# Patient Record
Sex: Female | Born: 1950 | Race: White | Hispanic: No | Marital: Married | Smoking: Never smoker
Health system: Southern US, Community
[De-identification: ages and names within clinical notes are randomized; demographics above are authoritative.]

## PROBLEM LIST (undated history)

## (undated) DIAGNOSIS — R519 Headache, unspecified: Secondary | ICD-10-CM

## (undated) DIAGNOSIS — K589 Irritable bowel syndrome without diarrhea: Secondary | ICD-10-CM

## (undated) DIAGNOSIS — K449 Diaphragmatic hernia without obstruction or gangrene: Secondary | ICD-10-CM

## (undated) DIAGNOSIS — J45909 Unspecified asthma, uncomplicated: Secondary | ICD-10-CM

## (undated) DIAGNOSIS — M791 Myalgia, unspecified site: Secondary | ICD-10-CM

## (undated) DIAGNOSIS — M170 Bilateral primary osteoarthritis of knee: Secondary | ICD-10-CM

## (undated) DIAGNOSIS — G4730 Sleep apnea, unspecified: Secondary | ICD-10-CM

## (undated) DIAGNOSIS — M810 Age-related osteoporosis without current pathological fracture: Secondary | ICD-10-CM

## (undated) DIAGNOSIS — K225 Diverticulum of esophagus, acquired: Secondary | ICD-10-CM

## (undated) DIAGNOSIS — G2581 Restless legs syndrome: Secondary | ICD-10-CM

## (undated) DIAGNOSIS — T7840XA Allergy, unspecified, initial encounter: Secondary | ICD-10-CM

## (undated) DIAGNOSIS — R51 Headache: Secondary | ICD-10-CM

## (undated) DIAGNOSIS — K219 Gastro-esophageal reflux disease without esophagitis: Secondary | ICD-10-CM

## (undated) DIAGNOSIS — M1990 Unspecified osteoarthritis, unspecified site: Secondary | ICD-10-CM

## (undated) HISTORY — PX: KYPHOPLASTY: SHX5884

## (undated) HISTORY — PX: MENISCUS REPAIR: SHX5179

## (undated) HISTORY — DX: Age-related osteoporosis without current pathological fracture: M81.0

## (undated) HISTORY — DX: Allergy, unspecified, initial encounter: T78.40XA

## (undated) HISTORY — DX: Gastro-esophageal reflux disease without esophagitis: K21.9

## (undated) HISTORY — DX: Unspecified asthma, uncomplicated: J45.909

## (undated) HISTORY — DX: Diaphragmatic hernia without obstruction or gangrene: K44.9

## (undated) HISTORY — DX: Headache, unspecified: R51.9

## (undated) HISTORY — DX: Sleep apnea, unspecified: G47.30

## (undated) HISTORY — DX: Headache: R51

## (undated) HISTORY — PX: ESOPHAGOGASTRODUODENOSCOPY: SHX1529

---

## 2004-10-23 HISTORY — PX: CHOLECYSTECTOMY: SHX55

## 2004-10-23 HISTORY — PX: ABDOMINAL HYSTERECTOMY: SHX81

## 2005-01-25 ENCOUNTER — Ambulatory Visit: Payer: Self-pay | Admitting: Unknown Physician Specialty

## 2005-01-27 ENCOUNTER — Ambulatory Visit: Payer: Self-pay | Admitting: Gastroenterology

## 2005-04-06 ENCOUNTER — Ambulatory Visit: Payer: Self-pay | Admitting: Gastroenterology

## 2006-04-17 ENCOUNTER — Ambulatory Visit: Payer: Self-pay | Admitting: Unknown Physician Specialty

## 2006-07-03 ENCOUNTER — Ambulatory Visit: Payer: Self-pay | Admitting: Specialist

## 2008-07-28 ENCOUNTER — Ambulatory Visit: Payer: Self-pay | Admitting: Internal Medicine

## 2008-07-30 ENCOUNTER — Ambulatory Visit: Payer: Self-pay | Admitting: Surgery

## 2008-09-10 ENCOUNTER — Ambulatory Visit: Payer: Self-pay | Admitting: Unknown Physician Specialty

## 2009-02-09 ENCOUNTER — Ambulatory Visit: Payer: Self-pay | Admitting: Unknown Physician Specialty

## 2009-02-11 ENCOUNTER — Ambulatory Visit: Payer: Self-pay | Admitting: Unknown Physician Specialty

## 2009-09-30 ENCOUNTER — Ambulatory Visit: Payer: Self-pay | Admitting: Internal Medicine

## 2009-10-18 ENCOUNTER — Ambulatory Visit: Payer: Self-pay | Admitting: Unknown Physician Specialty

## 2009-11-23 ENCOUNTER — Ambulatory Visit: Payer: Self-pay | Admitting: Gastroenterology

## 2009-12-13 ENCOUNTER — Ambulatory Visit: Payer: Self-pay | Admitting: Unknown Physician Specialty

## 2009-12-17 ENCOUNTER — Ambulatory Visit: Payer: Self-pay | Admitting: Unknown Physician Specialty

## 2010-09-28 ENCOUNTER — Ambulatory Visit: Payer: Self-pay | Admitting: Unknown Physician Specialty

## 2011-10-02 ENCOUNTER — Ambulatory Visit: Payer: Self-pay | Admitting: Unknown Physician Specialty

## 2011-10-03 ENCOUNTER — Ambulatory Visit: Payer: Self-pay | Admitting: Unknown Physician Specialty

## 2013-10-03 ENCOUNTER — Ambulatory Visit: Payer: Self-pay | Admitting: Gastroenterology

## 2013-10-23 HISTORY — PX: COLONOSCOPY: SHX174

## 2014-01-08 DIAGNOSIS — K219 Gastro-esophageal reflux disease without esophagitis: Secondary | ICD-10-CM | POA: Insufficient documentation

## 2014-06-14 DIAGNOSIS — J45909 Unspecified asthma, uncomplicated: Secondary | ICD-10-CM | POA: Insufficient documentation

## 2014-08-04 ENCOUNTER — Ambulatory Visit: Payer: Self-pay | Admitting: Internal Medicine

## 2016-08-30 ENCOUNTER — Other Ambulatory Visit: Payer: Self-pay | Admitting: Obstetrics & Gynecology

## 2016-08-30 DIAGNOSIS — Z1231 Encounter for screening mammogram for malignant neoplasm of breast: Secondary | ICD-10-CM

## 2016-10-06 ENCOUNTER — Ambulatory Visit
Admission: RE | Admit: 2016-10-06 | Discharge: 2016-10-06 | Disposition: A | Discharge status: Home / Self Care | Payer: Medicare Other | Source: Ambulatory Visit | Attending: Obstetrics & Gynecology | Admitting: Obstetrics & Gynecology

## 2016-10-06 ENCOUNTER — Encounter: Payer: Self-pay | Admitting: Radiology

## 2016-10-06 DIAGNOSIS — Z1231 Encounter for screening mammogram for malignant neoplasm of breast: Secondary | ICD-10-CM | POA: Diagnosis not present

## 2017-04-12 DIAGNOSIS — Z0000 Encounter for general adult medical examination without abnormal findings: Secondary | ICD-10-CM | POA: Insufficient documentation

## 2017-05-02 ENCOUNTER — Encounter: Payer: Self-pay | Admitting: *Deleted

## 2017-05-15 ENCOUNTER — Encounter: Payer: Self-pay | Admitting: *Deleted

## 2017-05-21 ENCOUNTER — Encounter: Payer: Self-pay | Admitting: General Surgery

## 2017-05-21 ENCOUNTER — Ambulatory Visit (INDEPENDENT_AMBULATORY_CARE_PROVIDER_SITE_OTHER): Payer: Medicare Other | Admitting: General Surgery

## 2017-05-21 VITALS — BP 136/76 | HR 88 | Resp 14 | Ht 67.0 in | Wt 205.0 lb

## 2017-05-21 DIAGNOSIS — D170 Benign lipomatous neoplasm of skin and subcutaneous tissue of head, face and neck: Secondary | ICD-10-CM

## 2017-05-21 NOTE — Progress Notes (Signed)
Patient ID: Diana Ray, female   DOB: Nov 20, 1950, 66 y.o.   MRN: [id number]  Chief Complaint  Patient presents with  . Other    lipoma of neck    HPI Diana Ray is a 66 y.o. female here today for a evaluation of a lipoma on neck. Patient noticed this area about three weeks ago by Grayland Ormond PA . No pain or change in size since.  HPI  Past Medical History:  Diagnosis Date  . Allergy   . Asthma   . GERD (gastroesophageal reflux disease)   . Headache, acute   . Hernia, hiatal   . Osteoporosis   . Sleep apnea     Past Surgical History:  Procedure Laterality Date  . ABDOMINAL HYSTERECTOMY    . CHOLECYSTECTOMY    . COLONOSCOPY  2015  . MENISCUS REPAIR  2010,2011    History reviewed. No pertinent family history.  Social History Social History  Substance Use Topics  . Smoking status: Never Smoker  . Smokeless tobacco: Never Used  . Alcohol use Yes    No Known Allergies  Current Outpatient Prescriptions  Medication Sig Dispense Refill  . carbidopa-levodopa (SINEMET IR) 25-100 MG tablet TAKE 1 OR 2 TABLETS BY MOUTH nightly  3  . cetirizine (ZYRTEC) 10 MG tablet Take by mouth.    . cyclobenzaprine (FLEXERIL) 10 MG tablet TAKE ONE TABLET BY MOUTH AT BEDTIME    . DULoxetine (CYMBALTA) 30 MG capsule Take 30 mg by mouth daily.  12  . Fluticasone-Salmeterol (ADVAIR DISKUS) 250-50 MCG/DOSE AEPB INHALE ONE PUFF INTO THE LUNGS EVERY TWELVE HOURS    . omeprazole (PRILOSEC) 20 MG capsule Take 1 tablet by mouth once daily. Take 30 min before meals.  11   No current facility-administered medications for this visit.     Review of Systems Review of Systems  Blood pressure 136/76, pulse 88, resp. rate 14, height 5\' 7"  (1.702 m), weight 205 lb (93 kg).  Physical Exam Physical Exam  Constitutional: She is oriented to person, place, and time. She appears well-developed.  Neck:    Neurological: She is alert and oriented to person, place, and time.  Skin:  Skin is warm and dry.      Assessment    Lipoma, base left neck.    Plan         Patient to return for excision left neck lipoma.   HPI, Physical Exam, Assessment and Plan have been scribed under the direction and in the presence of Hervey Ard, MD.  Gaspar Cola, CMA  I have completed the exam and reviewed the above documentation for accuracy and completeness.  I agree with the above.  Haematologist has been used and any errors in dictation or transcription are unintentional.  Hervey Ard, M.D., F.A.C.S.   Robert Bellow 05/21/2017, 7:55 PM

## 2017-05-21 NOTE — Patient Instructions (Signed)
Lipoma A lipoma is a noncancerous (benign) tumor that is made up of fat cells. This is a very common type of soft-tissue growth. Lipomas are usually found under the skin (subcutaneous). They may occur in any tissue of the body that contains fat. Common areas for lipomas to appear include the back, shoulders, buttocks, and thighs. Lipomas grow slowly, and they are usually painless. Most lipomas do not cause problems and do not require treatment. What are the causes? The cause of this condition is not known. What increases the risk? This condition is more likely to develop in:  People who are 40-60 years old.  People who have a family history of lipomas.  What are the signs or symptoms? A lipoma usually appears as a small, round bump under the skin. It may feel soft or rubbery, but the firmness can vary. Most lipomas are not painful. However, a lipoma may become painful if it is located in an area where it pushes on nerves. How is this diagnosed? A lipoma can usually be diagnosed with a physical exam. You may also have tests to confirm the diagnosis and to rule out other conditions. Tests may include:  Imaging tests, such as a CT scan or MRI.  Removal of a tissue sample to be looked at under a microscope (biopsy).  How is this treated? Treatment is not needed for small lipomas that are not causing problems. If a lipoma continues to get bigger or it causes problems, removal is often the best option. Lipomas can also be removed to improve appearance. Removal of a lipoma is usually done with a surgery in which the fatty cells and the surrounding capsule are removed. Most often, a medicine that numbs the area (local anesthetic) is used for this procedure. Follow these instructions at home:  Keep all follow-up visits as directed by your health care provider. This is important. Contact a health care provider if:  Your lipoma becomes larger or hard.  Your lipoma becomes painful, red, or  increasingly swollen. These could be signs of infection or a more serious condition. This information is not intended to replace advice given to you by your health care provider. Make sure you discuss any questions you have with your health care provider. Document Released: 09/29/2002 Document Revised: 03/16/2016 Document Reviewed: 10/05/2014 Elsevier Interactive Patient Education  2018 Elsevier Inc.  

## 2017-05-23 HISTORY — PX: LIPOMA EXCISION: SHX5283

## 2017-06-04 ENCOUNTER — Encounter: Payer: Self-pay | Admitting: General Surgery

## 2017-06-04 ENCOUNTER — Ambulatory Visit (INDEPENDENT_AMBULATORY_CARE_PROVIDER_SITE_OTHER): Payer: Medicare Other | Admitting: General Surgery

## 2017-06-04 VITALS — BP 132/68 | HR 82 | Resp 14 | Ht 64.0 in | Wt 205.0 lb

## 2017-06-04 DIAGNOSIS — D210 Benign neoplasm of connective and other soft tissue of head, face and neck: Secondary | ICD-10-CM | POA: Diagnosis not present

## 2017-06-04 DIAGNOSIS — D170 Benign lipomatous neoplasm of skin and subcutaneous tissue of head, face and neck: Secondary | ICD-10-CM

## 2017-06-04 NOTE — Progress Notes (Signed)
Patient ID: Diana Ray, female   DOB: 1951-04-30, 66 y.o.   MRN: [id number]  Chief Complaint  Patient presents with  . Procedure    neck excision    HPI Diana Ray is a 66 y.o. female is here for a neck excision. Patient states the area is unchanged. HPI  Past Medical History:  Diagnosis Date  . Allergy   . Asthma   . GERD (gastroesophageal reflux disease)   . Headache, acute   . Hernia, hiatal   . Osteoporosis   . Sleep apnea     Past Surgical History:  Procedure Laterality Date  . ABDOMINAL HYSTERECTOMY    . CHOLECYSTECTOMY    . COLONOSCOPY  2015  . MENISCUS REPAIR  2010,2011    No family history on file.  Social History Social History  Substance Use Topics  . Smoking status: Never Smoker  . Smokeless tobacco: Never Used  . Alcohol use Yes    No Known Allergies  Current Outpatient Prescriptions  Medication Sig Dispense Refill  . carbidopa-levodopa (SINEMET IR) 25-100 MG tablet TAKE 1 OR 2 TABLETS BY MOUTH nightly  3  . cetirizine (ZYRTEC) 10 MG tablet Take by mouth.    . cyclobenzaprine (FLEXERIL) 10 MG tablet TAKE ONE TABLET BY MOUTH AT BEDTIME    . DULoxetine (CYMBALTA) 30 MG capsule Take 30 mg by mouth daily.  12  . Fluticasone-Salmeterol (ADVAIR DISKUS) 250-50 MCG/DOSE AEPB INHALE ONE PUFF INTO THE LUNGS EVERY TWELVE HOURS    . omeprazole (PRILOSEC) 20 MG capsule Take 1 tablet by mouth once daily. Take 30 min before meals.  11   No current facility-administered medications for this visit.     Review of Systems Review of Systems  Constitutional: Negative.   Respiratory: Negative.   Cardiovascular: Negative.     Blood pressure 132/68, pulse 82, resp. rate 14, height 5\' 4"  (1.626 m), weight 205 lb (93 kg).  Physical Exam Physical Exam  Neck:      Assessment    Enlarging right posterior neck lipoma.    Plan    The procedure was reviewed and she was amenable to proceed. The skin was cleansed with ChloraPrep. 20 mL of  0.5% Xylocaine with 0.25% Marcaine with 1-200,000 of epinephrine was utilized well tolerated. The area was cleansed once again with ChloraPrep. A transverse incision over the mass was made and carried at the skin and subcutaneous tissue. The pseudocapsule around the lipoma was identified and entered. A multilobulated mass consistent with a simple lipoma was found to extend down to but not through the underlying fascia. Hemostasis was with 3-0 Vicryl ties. The wound was approximated with a interrupted layer of 3-0 Vicryl sutures to the deep adipose layer followed by a running 4-0 Vicryl subcuticular suture. Benzoin, Steri-Strips followed by Telfa and Tegaderm dressing applied.  The procedure was well tolerated.  Ice pack provided.  Patient encouraged to use ice intermittently for the next 48 hours. Tylenol/Advil/Aleve if needed for pain.    Follow up in  1 week for nursing wound check.   HPI, Physical Exam, Assessment and Plan have been scribed under the direction and in the presence of Diana Ard, MD.  Diana Ray, CMA  I have completed the exam and reviewed the above documentation for accuracy and completeness.  I agree with the above.  Haematologist has been used and any errors in dictation or transcription are unintentional.  Diana Ray, M.D., F.A.C.S.  Diana Ray 06/04/2017, 9:02 PM

## 2017-06-04 NOTE — Patient Instructions (Signed)
Lipoma Removal, Care After  Refer to this sheet in the next few weeks. These instructions provide you with information about caring for yourself after your procedure. Your health care provider may also give you more specific instructions. Your treatment has been planned according to current medical practices, but problems sometimes occur. Call your health care provider if you have any problems or questions after your procedure.  What can I expect after the procedure?  After the procedure, it is common to have:  · Mild pain.  · Swelling.  · Bruising.    Follow these instructions at home:    Bathing  · Do not take baths, swim, or use a hot tub until your health care provider approves. Ask your health care provider if you can take showers. You may only be allowed to take sponge baths for bathing.  · Keep your bandage (dressing) dry until your health care provider says it can be removed.  Incision care    · Follow instructions from your health care provider about how to take care of your incision. Make sure you:  ? Wash your hands with soap and water before you change your bandage (dressing). If soap and water are not available, use hand sanitizer.  ? Change your dressing as told by your health care provider.  ? Leave stitches (sutures), skin glue, or adhesive strips in place. These skin closures may need to stay in place for 2 weeks or longer. If adhesive strip edges start to loosen and curl up, you may trim the loose edges. Do not remove adhesive strips completely unless your health care provider tells you to do that.  · Check your incision area every day for signs of infection. Check for:  ? More redness, swelling, or pain.  ? Fluid or blood.  ? Warmth.  ? Pus or a bad smell.  Driving  · Do not drive or operate heavy machinery while taking prescription pain medicine.  · Do not drive for 24 hours if you received a medicine to help you relax (sedative) during your procedure.  · Ask your health care provider when it is  safe for you to drive.  General instructions  · Take over-the-counter and prescription medicines only as told by your health care provider.  · Do not use any tobacco products, such as cigarettes, chewing tobacco, and e-cigarettes. These can delay healing. If you need help quitting, ask your health care provider.  · Return to your normal activities as told by your health care provider. Ask your health care provider what activities are safe for you.  · Keep all follow-up visits as told by your health care provider. This is important.  Contact a health care provider if:  · You have more redness, swelling, or pain around your incision.  · You have fluid or blood coming from your incision.  · Your incision feels warm to the touch.  · You have pus or a bad smell coming from your incision.  · You have pain that does not get better with medicine.  Get help right away if:  · You have chills or a fever.  · You have severe pain.  This information is not intended to replace advice given to you by your health care provider. Make sure you discuss any questions you have with your health care provider.  Document Released: 12/23/2015 Document Revised: 03/21/2016 Document Reviewed: 12/23/2015  Elsevier Interactive Patient Education © 2018 Elsevier Inc.

## 2017-06-07 ENCOUNTER — Telehealth: Payer: Self-pay

## 2017-06-07 NOTE — Telephone Encounter (Signed)
Notified patient as instructed, patient pleased. Discussed follow-up appointments, patient agrees  

## 2017-06-07 NOTE — Telephone Encounter (Signed)
-----   Message from Robert Bellow, MD sent at 06/06/2017  8:43 PM EDT ----- Notify pathology fine, fatty growth as expected.  ----- Message ----- From: Interface, Lab In [facility] Sent: 06/06/2017   3:30 PM To: Robert Bellow, MD

## 2017-06-11 ENCOUNTER — Ambulatory Visit (INDEPENDENT_AMBULATORY_CARE_PROVIDER_SITE_OTHER): Payer: Medicare Other | Admitting: *Deleted

## 2017-06-11 DIAGNOSIS — D170 Benign lipomatous neoplasm of skin and subcutaneous tissue of head, face and neck: Secondary | ICD-10-CM

## 2017-06-11 NOTE — Progress Notes (Signed)
Patient came in today for a wound check.  The wound is clean, with no signs of infection noted. Follow up as needed.  

## 2017-08-24 DIAGNOSIS — G500 Trigeminal neuralgia: Secondary | ICD-10-CM | POA: Insufficient documentation

## 2017-10-04 ENCOUNTER — Other Ambulatory Visit: Payer: Self-pay | Admitting: Obstetrics & Gynecology

## 2017-10-04 DIAGNOSIS — Z1231 Encounter for screening mammogram for malignant neoplasm of breast: Secondary | ICD-10-CM

## 2017-11-01 ENCOUNTER — Encounter: Payer: Self-pay | Admitting: Radiology

## 2017-11-01 ENCOUNTER — Ambulatory Visit
Admission: RE | Admit: 2017-11-01 | Discharge: 2017-11-01 | Disposition: A | Discharge status: Home / Self Care | Payer: Medicare Other | Source: Ambulatory Visit | Attending: Obstetrics & Gynecology | Admitting: Obstetrics & Gynecology

## 2017-11-01 DIAGNOSIS — Z1231 Encounter for screening mammogram for malignant neoplasm of breast: Secondary | ICD-10-CM | POA: Diagnosis not present

## 2017-12-13 ENCOUNTER — Other Ambulatory Visit: Payer: Self-pay | Admitting: Gastroenterology

## 2017-12-13 DIAGNOSIS — K219 Gastro-esophageal reflux disease without esophagitis: Secondary | ICD-10-CM

## 2017-12-13 DIAGNOSIS — R1319 Other dysphagia: Secondary | ICD-10-CM

## 2017-12-13 DIAGNOSIS — R1310 Dysphagia, unspecified: Secondary | ICD-10-CM

## 2017-12-21 ENCOUNTER — Ambulatory Visit
Admission: RE | Admit: 2017-12-21 | Discharge: 2017-12-21 | Disposition: A | Discharge status: Home / Self Care | Payer: Medicare Other | Source: Ambulatory Visit | Attending: Gastroenterology | Admitting: Gastroenterology

## 2017-12-21 DIAGNOSIS — K225 Diverticulum of esophagus, acquired: Secondary | ICD-10-CM | POA: Insufficient documentation

## 2017-12-21 DIAGNOSIS — K449 Diaphragmatic hernia without obstruction or gangrene: Secondary | ICD-10-CM | POA: Insufficient documentation

## 2017-12-21 DIAGNOSIS — R1310 Dysphagia, unspecified: Secondary | ICD-10-CM | POA: Diagnosis present

## 2017-12-21 DIAGNOSIS — R1319 Other dysphagia: Secondary | ICD-10-CM

## 2017-12-21 DIAGNOSIS — K222 Esophageal obstruction: Secondary | ICD-10-CM | POA: Diagnosis not present

## 2017-12-21 DIAGNOSIS — K219 Gastro-esophageal reflux disease without esophagitis: Secondary | ICD-10-CM | POA: Insufficient documentation

## 2018-01-09 ENCOUNTER — Ambulatory Visit
Admission: RE | Admit: 2018-01-09 | Discharge: 2018-01-09 | Disposition: A | Discharge status: Home / Self Care | Payer: Medicare Other | Source: Ambulatory Visit | Attending: Internal Medicine | Admitting: Internal Medicine

## 2018-01-09 ENCOUNTER — Ambulatory Visit: Payer: Medicare Other | Admitting: Certified Registered"

## 2018-01-09 ENCOUNTER — Other Ambulatory Visit: Payer: Self-pay

## 2018-01-09 ENCOUNTER — Encounter
Admission: RE | Disposition: A | Discharge status: Home / Self Care | Payer: Self-pay | Source: Ambulatory Visit | Attending: Internal Medicine

## 2018-01-09 DIAGNOSIS — R1314 Dysphagia, pharyngoesophageal phase: Secondary | ICD-10-CM | POA: Diagnosis not present

## 2018-01-09 DIAGNOSIS — G4730 Sleep apnea, unspecified: Secondary | ICD-10-CM | POA: Insufficient documentation

## 2018-01-09 DIAGNOSIS — J45909 Unspecified asthma, uncomplicated: Secondary | ICD-10-CM | POA: Insufficient documentation

## 2018-01-09 DIAGNOSIS — Z79899 Other long term (current) drug therapy: Secondary | ICD-10-CM | POA: Insufficient documentation

## 2018-01-09 DIAGNOSIS — K5710 Diverticulosis of small intestine without perforation or abscess without bleeding: Secondary | ICD-10-CM | POA: Insufficient documentation

## 2018-01-09 DIAGNOSIS — G2581 Restless legs syndrome: Secondary | ICD-10-CM | POA: Insufficient documentation

## 2018-01-09 DIAGNOSIS — K449 Diaphragmatic hernia without obstruction or gangrene: Secondary | ICD-10-CM | POA: Insufficient documentation

## 2018-01-09 DIAGNOSIS — K219 Gastro-esophageal reflux disease without esophagitis: Secondary | ICD-10-CM | POA: Diagnosis not present

## 2018-01-09 DIAGNOSIS — K225 Diverticulum of esophagus, acquired: Secondary | ICD-10-CM | POA: Insufficient documentation

## 2018-01-09 DIAGNOSIS — K589 Irritable bowel syndrome without diarrhea: Secondary | ICD-10-CM | POA: Insufficient documentation

## 2018-01-09 DIAGNOSIS — Z7951 Long term (current) use of inhaled steroids: Secondary | ICD-10-CM | POA: Diagnosis not present

## 2018-01-09 DIAGNOSIS — R1310 Dysphagia, unspecified: Secondary | ICD-10-CM | POA: Diagnosis present

## 2018-01-09 HISTORY — DX: Restless legs syndrome: G25.81

## 2018-01-09 HISTORY — PX: ESOPHAGOGASTRODUODENOSCOPY (EGD) WITH PROPOFOL: SHX5813

## 2018-01-09 HISTORY — DX: Irritable bowel syndrome, unspecified: K58.9

## 2018-01-09 HISTORY — DX: Unspecified osteoarthritis, unspecified site: M19.90

## 2018-01-09 SURGERY — ESOPHAGOGASTRODUODENOSCOPY (EGD) WITH PROPOFOL
Anesthesia: General

## 2018-01-09 MED ORDER — PROPOFOL 10 MG/ML IV BOLUS
INTRAVENOUS | Status: DC | PRN
  Administered 2018-01-09: 70 mg via INTRAVENOUS
  Administered 2018-01-09: 30 mg via INTRAVENOUS

## 2018-01-09 MED ORDER — GLYCOPYRROLATE 0.2 MG/ML IJ SOLN
INTRAMUSCULAR | Status: AC
  Filled 2018-01-09: qty 2

## 2018-01-09 MED ORDER — MIDAZOLAM HCL 5 MG/5ML IJ SOLN
INTRAMUSCULAR | Status: DC | PRN
  Administered 2018-01-09: 2 mg via INTRAVENOUS

## 2018-01-09 MED ORDER — LIDOCAINE 2% (20 MG/ML) 5 ML SYRINGE
INTRAMUSCULAR | Status: DC | PRN
  Administered 2018-01-09: 25 mg via INTRAVENOUS

## 2018-01-09 MED ORDER — GLYCOPYRROLATE 0.2 MG/ML IJ SOLN
INTRAMUSCULAR | Status: DC | PRN
  Administered 2018-01-09: 0.2 mg via INTRAVENOUS

## 2018-01-09 MED ORDER — PROPOFOL 500 MG/50ML IV EMUL
INTRAVENOUS | Status: DC | PRN
  Administered 2018-01-09: 120 ug/kg/min via INTRAVENOUS

## 2018-01-09 MED ORDER — MIDAZOLAM HCL 2 MG/2ML IJ SOLN
INTRAMUSCULAR | Status: AC
  Filled 2018-01-09: qty 2

## 2018-01-09 MED ORDER — SODIUM CHLORIDE 0.9 % IV SOLN
INTRAVENOUS | Status: DC
  Administered 2018-01-09: 11:00:00 via INTRAVENOUS

## 2018-01-09 NOTE — Progress Notes (Signed)
Patient complained of a sore throat. Instructed patient to use salt water gargle or throat drops.  Dr. Everlene Balls also gave the same instruction

## 2018-01-09 NOTE — Op Note (Addendum)
[facility] Gastroenterology Patient Name: Diana Ray Procedure Date: 01/09/2018 11:16 AM MRN: [id number] Account #: [id number] Date of Birth: 02-25-1951 Admit Type: Outpatient Age: 67 Room: [facility] ENDO ROOM 3 Gender: Female Note Status: Finalized Procedure:            Upper GI endoscopy Indications:          Esophageal dysphagia, Esophageal reflux Providers:            Benay Pike. Alice Reichert MD, MD Referring MD:         Ocie Cornfield. Ouida Sills MD, MD (Referring MD) Medicines:            Propofol per Anesthesia Complications:        No immediate complications. Procedure:            Pre-Anesthesia Assessment:                       - The risks and benefits of the procedure and the                        sedation options and risks were discussed with the                        patient. All questions were answered and informed                        consent was obtained.                       - Patient identification and proposed procedure were                        verified prior to the procedure by the nurse. The                        procedure was verified in the procedure room.                       - ASA Grade Assessment: II - A patient with mild                        systemic disease.                       - After reviewing the risks and benefits, the patient                        was deemed in satisfactory condition to undergo the                        procedure.                       After obtaining informed consent, the endoscope was                        passed under direct vision. Throughout the procedure,                        the patient's blood pressure, pulse, and oxygen  saturations were monitored continuously. The Endoscope                        was introduced through the mouth, and advanced to the                        third part of duodenum. The upper GI endoscopy was                        accomplished without  difficulty. The patient tolerated                        the procedure well. Findings:      Moderate tortuosity of the mid to distal esophagus was noted compatible       with a diagnosis of Presbyesophagus.      A non-bleeding Zenker's diverticulum with a small opening, no impacted       food and no stigmata of recent bleeding was found.      A medium-sized hiatal hernia was present.      The exam was otherwise without abnormality.      A 5 mm non-bleeding diverticulum was found in the second portion of the       duodenum.      A low-grade of narrowing Schatzki ring (acquired) was found at the       gastroesophageal junction. The scope was withdrawn. Dilation was       performed with a Maloney dilator with mild resistance at 15 Fr. The       dilation site was examined following endoscope reinsertion and showed       moderate improvement in luminal narrowing. Impression:           - Zenker's diverticulum.                       - Medium-sized hiatal hernia.                       - The examination was otherwise normal.                       - Non-bleeding duodenal diverticulum.                       - No specimens collected. Recommendation:       - Resume previous diet.                       - Continue present medications.                       - Repeat upper endoscopy as needed for dysphagia.                       - Return to GI office in 3 months.                       - The findings and recommendations were discussed with                        the patient and their spouse. Procedure Code(s):    --- Professional ---  31517, Esophagogastroduodenoscopy, flexible, transoral;                        diagnostic, including collection of specimen(s) by                        brushing or washing, when performed (separate procedure)                       43450, Dilation of esophagus, by unguided sound or                        bougie, single or multiple passes Diagnosis  Code(s):    --- Professional ---                       K22.5, Diverticulum of esophagus, acquired                       K44.9, Diaphragmatic hernia without obstruction or                        gangrene                       R13.14, Dysphagia, pharyngoesophageal phase                       K21.9, Gastro-esophageal reflux disease without                        esophagitis                       K57.10, Diverticulosis of small intestine without                        perforation or abscess without bleeding CPT copyright 2016 American Medical Association. All rights reserved. The codes documented in this report are preliminary and upon coder review may  be revised to meet current compliance requirements. Efrain Sella MD, MD 01/09/2018 11:58:03 AM This report has been signed electronically. Number of Addenda: 0 Note Initiated On: 01/09/2018 11:16 AM Total Procedure Duration: 0 hours 6 minutes 31 seconds       [facility]

## 2018-01-09 NOTE — Anesthesia Post-op Follow-up Note (Signed)
Anesthesia QCDR form completed.        

## 2018-01-09 NOTE — Transfer of Care (Signed)
Immediate Anesthesia Transfer of Care Note  Patient: Diana Ray  Procedure(s) Performed: ESOPHAGOGASTRODUODENOSCOPY (EGD) WITH PROPOFOL (N/A )  Patient Location: Endoscopy Unit  Anesthesia Type:General  Level of Consciousness: awake  Airway & Oxygen Therapy: Patient Spontanous Breathing and Patient connected to nasal cannula oxygen  Post-op Assessment: Report given to RN and Post -op Vital signs reviewed and stable  Post vital signs: Reviewed  Last Vitals:  Vitals:   01/09/18 1049 01/09/18 1154  BP: 126/82   Pulse: 91 100  Resp: 20 (!) 22  Temp: (!) 35.5 C   SpO2: 97% 94%    Last Pain:  Vitals:   01/09/18 1049  TempSrc: Tympanic         Complications: No apparent anesthesia complications

## 2018-01-09 NOTE — Anesthesia Preprocedure Evaluation (Signed)
Anesthesia Evaluation  Patient identified by MRN, date of birth, ID band Patient awake    Reviewed: Allergy & Precautions, NPO status , Patient's Chart, lab work & pertinent test results  History of Anesthesia Complications Negative for: history of anesthetic complications  Airway Mallampati: II  TM Distance: >3 FB Neck ROM: Full    Dental no notable dental hx.    Pulmonary asthma (mild intermittent) , sleep apnea (mild, does not have CPAP prescribed) ,    breath sounds clear to auscultation- rhonchi (-) wheezing      Cardiovascular Exercise Tolerance: Good (-) hypertension(-) CAD, (-) Past MI, (-) Cardiac Stents and (-) CABG  Rhythm:Regular Rate:Normal - Systolic murmurs and - Diastolic murmurs    Neuro/Psych  Headaches, negative psych ROS   GI/Hepatic Neg liver ROS, hiatal hernia, GERD  ,  Endo/Other  negative endocrine ROSneg diabetes  Renal/GU negative Renal ROS     Musculoskeletal  (+) Arthritis ,   Abdominal (+) + obese,   Peds  Hematology negative hematology ROS (+)   Anesthesia Other Findings Past Medical History: No date: Allergy No date: Arthritis     Comment:  both knees, both hands and lumbar spine. No date: Asthma No date: GERD (gastroesophageal reflux disease) No date: Headache, acute No date: Hernia, hiatal No date: IBS (irritable bowel syndrome) No date: Osteoporosis No date: RLS (restless legs syndrome) No date: Sleep apnea   Reproductive/Obstetrics                             Anesthesia Physical Anesthesia Plan  ASA: II  Anesthesia Plan: General   Post-op Pain Management:    Induction: Intravenous  PONV Risk Score and Plan: 2 and Propofol infusion  Airway Management Planned: Natural Airway  Additional Equipment:   Intra-op Plan:   Post-operative Plan:   Informed Consent: I have reviewed the patients History and Physical, chart, labs and discussed  the procedure including the risks, benefits and alternatives for the proposed anesthesia with the patient or authorized representative who has indicated his/her understanding and acceptance.   Dental advisory given  Plan Discussed with: CRNA and Anesthesiologist  Anesthesia Plan Comments:         Anesthesia Quick Evaluation

## 2018-01-09 NOTE — H&P (Signed)
Outpatient short stay form Pre-procedure 01/09/2018 11:33 AM Diana Ray K. Alice Reichert, M.D.  Primary Physician: Frazier Richards, M.D.  Reason for visit: Dysphagia, GERD.  History of present illness: patient is a pleasant 67 year old female presenting to office acutely on 12/13/2017 with complaints of esophageal food impaction which cleared after drinking liquids. Barium swallow performed on 12/21/2017 revealed smooth narrowing of the distal esophagus along with a moderately-sized Zenker's diverticulum    Current Facility-Administered Medications:  .  0.9 %  sodium chloride infusion, , Intravenous, Continuous, [place], Benay Pike, MD, Last Rate: 20 mL/hr at 01/09/18 1105  Facility-Administered Medications Ordered in Other Encounters:  .  glycopyrrolate (ROBINUL) injection, , Intravenous, Anesthesia Intra-op, Rolla Plate, CRNA, 0.2 mg at 01/09/18 1127 .  midazolam (VERSED) 5 MG/5ML injection, , Intravenous, Anesthesia Intra-op, [place] Plate, CRNA, 2 mg at 01/09/18 1127  Medications Prior to Admission  Medication Sig Dispense Refill Last Dose  . albuterol (PROVENTIL HFA;VENTOLIN HFA) 108 (90 Base) MCG/ACT inhaler Inhale 2 puffs into the lungs every 6 (six) hours as needed for wheezing or shortness of breath.   Past Month at Unknown time  . carbidopa-levodopa (SINEMET IR) 25-100 MG tablet TAKE 1 OR 2 TABLETS BY MOUTH nightly  3 01/08/2018 at 2000  . cetirizine (ZYRTEC) 10 MG tablet Take by mouth.   01/08/2018 at 0800  . cyclobenzaprine (FLEXERIL) 10 MG tablet TAKE ONE TABLET BY MOUTH AT BEDTIME   01/08/2018 at 2000  . DULoxetine (CYMBALTA) 30 MG capsule Take 30 mg by mouth daily.  12 01/08/2018 at 2000  . Fluticasone-Salmeterol (ADVAIR DISKUS) 250-50 MCG/DOSE AEPB INHALE ONE PUFF INTO THE LUNGS EVERY TWELVE HOURS   Past Week at Unknown time  . omeprazole (PRILOSEC) 20 MG capsule Take 1 tablet by mouth once daily. Take 30 min before meals.  11 01/08/2018 at 2000     No Known Allergies   Past  Medical History:  Diagnosis Date  . Allergy   . Arthritis    both knees, both hands and lumbar spine.  . Asthma   . GERD (gastroesophageal reflux disease)   . Headache, acute   . Hernia, hiatal   . IBS (irritable bowel syndrome)   . Osteoporosis   . RLS (restless legs syndrome)   . Sleep apnea     Review of systems:  As in HPI.   Physical Exam  Gen: Alert, oriented. Appears stated age.  HEENT: [place]/AT. PERRLA. Lungs: CTA, no wheezes. CV: RR nl S1, S2. Abd: soft, benign, no masses. BS+ Ext: No edema. Pulses 2+    Planned procedures: EGD. The patient understands the nature of the planned procedure, indications, risks, alternatives and potential complications including but not limited to bleeding, infection, perforation, damage to internal organs and possible oversedation/side effects from anesthesia. The patient agrees and gives consent to proceed.  Please refer to procedure notes for findings, recommendations and patient disposition/instructions.    Diana Ray K. Alice Reichert, M.D. Gastroenterology 01/09/2018  11:33 AM

## 2018-01-09 NOTE — Interval H&P Note (Signed)
History and Physical Interval Note:  01/09/2018 11:35 AM  Diana Ray  has presented today for surgery, with the diagnosis of DYSPHAGIA GERD  The various methods of treatment have been discussed with the patient and family. After consideration of risks, benefits and other options for treatment, the patient has consented to  Procedure(s): ESOPHAGOGASTRODUODENOSCOPY (EGD) WITH PROPOFOL (N/A) as a surgical intervention .  The patient's history has been reviewed, patient examined, no change in status, stable for surgery.  I have reviewed the patient's chart and labs.  Questions were answered to the patient's satisfaction.     [place], [place]

## 2018-01-10 NOTE — Anesthesia Postprocedure Evaluation (Signed)
Anesthesia Post Note  Patient: Diana Ray  Procedure(s) Performed: ESOPHAGOGASTRODUODENOSCOPY (EGD) WITH PROPOFOL (N/A )  Patient location during evaluation: Endoscopy Anesthesia Type: General Level of consciousness: awake and alert and oriented Pain management: pain level controlled Vital Signs Assessment: post-procedure vital signs reviewed and stable Respiratory status: spontaneous breathing, nonlabored ventilation and respiratory function stable Cardiovascular status: blood pressure returned to baseline and stable Postop Assessment: no signs of nausea or vomiting Anesthetic complications: no     Last Vitals:  Vitals Value Taken Time  BP    Temp    Pulse    Resp    SpO2      Last Pain:  Vitals:   01/10/18 0747  TempSrc:   PainSc: 0-No pain                 Maryanna Stuber

## 2018-01-11 ENCOUNTER — Encounter: Payer: Self-pay | Admitting: Internal Medicine

## 2018-02-27 DIAGNOSIS — M810 Age-related osteoporosis without current pathological fracture: Secondary | ICD-10-CM | POA: Insufficient documentation

## 2018-05-24 ENCOUNTER — Emergency Department: Payer: Medicare Other

## 2018-05-24 ENCOUNTER — Emergency Department
Admission: EM | Admit: 2018-05-24 | Discharge: 2018-05-24 | Disposition: A | Discharge status: Home / Self Care | Payer: Medicare Other | Attending: Emergency Medicine | Admitting: Emergency Medicine

## 2018-05-24 ENCOUNTER — Encounter: Payer: Self-pay | Admitting: Emergency Medicine

## 2018-05-24 DIAGNOSIS — Y999 Unspecified external cause status: Secondary | ICD-10-CM | POA: Diagnosis not present

## 2018-05-24 DIAGNOSIS — W11XXXA Fall on and from ladder, initial encounter: Secondary | ICD-10-CM | POA: Insufficient documentation

## 2018-05-24 DIAGNOSIS — S32020A Wedge compression fracture of second lumbar vertebra, initial encounter for closed fracture: Secondary | ICD-10-CM | POA: Insufficient documentation

## 2018-05-24 DIAGNOSIS — Z79899 Other long term (current) drug therapy: Secondary | ICD-10-CM | POA: Diagnosis not present

## 2018-05-24 DIAGNOSIS — J45909 Unspecified asthma, uncomplicated: Secondary | ICD-10-CM | POA: Insufficient documentation

## 2018-05-24 DIAGNOSIS — Y9339 Activity, other involving climbing, rappelling and jumping off: Secondary | ICD-10-CM | POA: Insufficient documentation

## 2018-05-24 DIAGNOSIS — S3992XA Unspecified injury of lower back, initial encounter: Secondary | ICD-10-CM | POA: Diagnosis present

## 2018-05-24 DIAGNOSIS — Y929 Unspecified place or not applicable: Secondary | ICD-10-CM | POA: Diagnosis not present

## 2018-05-24 MED ORDER — TRAMADOL HCL 50 MG PO TABS
50.0000 mg | ORAL_TABLET | Freq: Once | ORAL | Status: AC
  Administered 2018-05-24: 50 mg via ORAL
  Filled 2018-05-24: qty 1

## 2018-05-24 MED ORDER — METHOCARBAMOL 500 MG PO TABS
500.0000 mg | ORAL_TABLET | Freq: Once | ORAL | Status: AC
  Administered 2018-05-24: 500 mg via ORAL
  Filled 2018-05-24: qty 1

## 2018-05-24 MED ORDER — OXYCODONE-ACETAMINOPHEN 5-325 MG PO TABS: 1.0000 | ORAL_TABLET | Freq: Four times a day (QID) | ORAL | 0 refills | Status: AC | PRN

## 2018-05-24 MED ORDER — OXYCODONE-ACETAMINOPHEN 5-325 MG PO TABS
1.0000 | ORAL_TABLET | Freq: Once | ORAL | Status: AC
  Administered 2018-05-24: 1 via ORAL
  Filled 2018-05-24: qty 1

## 2018-05-24 MED ORDER — METHOCARBAMOL 500 MG PO TABS: 500.0000 mg | ORAL_TABLET | Freq: Three times a day (TID) | ORAL | 0 refills | Status: AC | PRN

## 2018-05-24 NOTE — ED Provider Notes (Signed)
[facility] Emergency Department Provider Note  ____________________________________________  Time seen: Approximately 8:38 PM  I have reviewed the triage vital signs and the nursing notes.   HISTORY  Chief Complaint Fall    HPI Diana Ray is a 67 y.o. female with a history of fibromyalgia, presents to the emergency department with 10 out of 10 spasming low back pain after patient missed a step on a ladder and fell 2 steps to a concrete surface.  Patient did not hit her head.  Patient reports that her pain is worse with movement and relieved with rest.  Patient denies weakness or tingling in the lower extremities.  No skin compromise. No alleviating measures have been attempted.    Past Medical History:  Diagnosis Date  . Allergy   . Arthritis    both knees, both hands and lumbar spine.  . Asthma   . GERD (gastroesophageal reflux disease)   . Headache, acute   . Hernia, hiatal   . IBS (irritable bowel syndrome)   . Osteoporosis   . RLS (restless legs syndrome)   . Sleep apnea     Patient Active Problem List   Diagnosis Date Noted  . Lipoma of neck 05/21/2017    Past Surgical History:  Procedure Laterality Date  . ABDOMINAL HYSTERECTOMY    . CHOLECYSTECTOMY    . COLONOSCOPY  2015  . ESOPHAGOGASTRODUODENOSCOPY    . ESOPHAGOGASTRODUODENOSCOPY (EGD) WITH PROPOFOL N/A 01/09/2018   Procedure: ESOPHAGOGASTRODUODENOSCOPY (EGD) WITH PROPOFOL;  Surgeon: Toledo, Benay Pike, MD;  Location: [facility] ENDOSCOPY;  Service: Gastroenterology;  Laterality: N/A;  . LIPOMA EXCISION  05/2017   back of neck  . MENISCUS REPAIR  [phone]    Prior to Admission medications   Medication Sig Start Date End Date Taking? Authorizing Provider  albuterol (PROVENTIL HFA;VENTOLIN HFA) 108 (90 Base) MCG/ACT inhaler Inhale 2 puffs into the lungs every 6 (six) hours as needed for wheezing or shortness of breath.    [provider]  carbidopa-levodopa (SINEMET IR)  25-100 MG tablet TAKE 1 OR 2 TABLETS BY MOUTH nightly 05/07/17   [provider]  cetirizine (ZYRTEC) 10 MG tablet Take by mouth.    [provider]  cyclobenzaprine (FLEXERIL) 10 MG tablet TAKE ONE TABLET BY MOUTH AT BEDTIME 11/10/16   [provider]  DULoxetine (CYMBALTA) 30 MG capsule Take 30 mg by mouth daily. 05/07/17   [provider]  Fluticasone-Salmeterol (ADVAIR DISKUS) 250-50 MCG/DOSE AEPB INHALE ONE PUFF INTO THE LUNGS EVERY TWELVE HOURS 06/09/16   [provider]  methocarbamol (ROBAXIN) 500 MG tablet Take 1 tablet (500 mg total) by mouth every 8 (eight) hours as needed for up to 5 days. 05/24/18 05/29/18  Lannie Fields, PA-C  omeprazole (PRILOSEC) 20 MG capsule Take 1 tablet by mouth once daily. Take 30 min before meals. 05/07/17   [provider]  oxyCODONE-acetaminophen (PERCOCET/ROXICET) 5-325 MG tablet Take 1 tablet by mouth every 6 (six) hours as needed for up to 5 days for severe pain. 05/24/18 05/29/18  Lannie Fields, PA-C    Allergies Patient has no known allergies.  Family History  Problem Relation Age of Onset  . Breast cancer Neg Hx     Social History Social History   Tobacco Use  . Smoking status: Never Smoker  . Smokeless tobacco: Never Used  Substance Use Topics  . Alcohol use: Yes    Alcohol/week: 2.4 oz    Types: 4 Standard drinks or equivalent per week  .  Drug use: No     Review of Systems  Constitutional: No fever/chills Eyes: No visual changes. No discharge ENT: No upper respiratory complaints. Cardiovascular: no chest pain. Respiratory: no cough. No SOB. Gastrointestinal: No abdominal pain.  No nausea, no vomiting.  No diarrhea.  No constipation. Musculoskeletal: Patient has low back pain.  Skin: Negative for rash, abrasions, lacerations, ecchymosis. Neurological: Negative for headaches, focal weakness or numbness.   ____________________________________________   PHYSICAL EXAM:  VITAL  SIGNS: ED Triage Vitals  Enc Vitals Group     BP 05/24/18 1550 121/69     Pulse Rate 05/24/18 1550 93     Resp 05/24/18 1550 18     Temp 05/24/18 1550 98.5 F (36.9 C)     Temp Source 05/24/18 1550 Oral     SpO2 05/24/18 1550 96 %     Weight 05/24/18 1552 202 lb (91.6 kg)     Height 05/24/18 1552 5\' 3"  (1.6 m)     Head Circumference --      Peak Flow --      Pain Score 05/24/18 1552 8     Pain Loc --      Pain Edu? --      Excl. in [place]? --      Constitutional: Alert and oriented. Well appearing and in no acute distress. Eyes: Conjunctivae are normal. PERRL. EOMI. Head: Atraumatic. ENT:      Ears: TMs are pearly.       Nose: No congestion/rhinnorhea.      Mouth/Throat: Mucous membranes are moist.  Neck: No stridor.  No cervical spine tenderness to palpation. Hematological/Lymphatic/Immunilogical: No cervical lymphadenopathy. Cardiovascular: Normal rate, regular rhythm. Normal S1 and S2.  Good peripheral circulation. Respiratory: Normal respiratory effort without tachypnea or retractions. Lungs CTAB. Good air entry to the bases with no decreased or absent breath sounds. Gastrointestinal: Bowel sounds 4 quadrants. Soft and nontender to palpation. No guarding or rigidity. No palpable masses. No distention. No CVA tenderness. Musculoskeletal: Full range of motion to all extremities. No gross deformities appreciated. Patient has midline lumbar spine tenderness over L2.  Negative straight leg raise bilaterally. Neurologic:  Normal speech and language. No gross focal neurologic deficits are appreciated.  Skin: No ecchymosis or abrasions of the skin overlying the lumbar spine.    ____________________________________________   LABS (all labs ordered are listed, but only abnormal results are displayed)  Labs Reviewed - No data to display ____________________________________________  EKG   ____________________________________________  RADIOLOGY I personally viewed and  evaluated these images as part of my medical decision making, as well as reviewing the written report by the radiologist.  Dg Lumbar Spine 2-3 Views  Result Date: 05/24/2018 CLINICAL DATA:  Back pain after fall. EXAM: LUMBAR SPINE - 2-3 VIEW COMPARISON:  No recent prior. FINDINGS: Lumbar spine numbered with the lowest segmented appearing lumbar shaped vertebrae on lateral view as L5. Diffuse osteopenia. Lumbar spine scoliosis concave left. Diffuse multilevel degenerative change. Mild L2 compression fracture, age undetermined. IMPRESSION: 1.  Mild L2 compression fracture, age undetermined. 2. Diffuse osteopenia and degenerative change. Mild scoliosis concave left. Electronically Signed   By: Marcello Moores  Register   On: 05/24/2018 17:29   Dg Sacrum/coccyx  Result Date: 05/24/2018 CLINICAL DATA:  Back pain after fall. EXAM: SACRUM AND COCCYX - 2+ VIEW COMPARISON:  No prior. FINDINGS: Diffuse osteopenia. No acute bony or joint abnormality noted of the sacrococcygeal region. Degenerative changes lumbosacral spine. IMPRESSION: Diffuse osteopenia.  No acute abnormality. Electronically Signed  By: [place]   On: 05/24/2018 17:31    ____________________________________________    PROCEDURES  Procedure(s) performed:    Procedures    Medications  traMADol (ULTRAM) tablet 50 mg (50 mg Oral Given 05/24/18 1644)  methocarbamol (ROBAXIN) tablet 500 mg (500 mg Oral Given 05/24/18 1644)  oxyCODONE-acetaminophen (PERCOCET/ROXICET) 5-325 MG per tablet 1 tablet (1 tablet Oral Given 05/24/18 1859)  methocarbamol (ROBAXIN) tablet 500 mg (500 mg Oral Given 05/24/18 2023)     ____________________________________________   INITIAL IMPRESSION / ASSESSMENT AND PLAN / ED COURSE  Pertinent labs & imaging results that were available during my care of the patient were reviewed by me and considered in my medical decision making (see chart for details).  Review of the [place] CSRS was performed in accordance of the [place]  prior to dispensing any controlled drugs.      Assessment and Plan:  L2 compression fracture Patient presents to the emergency department with low back pain after a fall that occurred earlier tonight.  Patient fell 2 steps from a ladder onto a concrete surface.  X-ray examination of the lumbar spine reveals a compression fracture of undetermined age.  Patient was given Robaxin and Percocet in the emergency department.  Neurologic exam was reassuring and patient was discharged with Percocet and Robaxin.  Patient was advised to follow-up with neurosurgery, Dr. Cari Caraway.  Strict return precautions were given to return to the emergency department for new or worsening symptoms.  Patient voiced understanding.  Vital signs are reassuring prior to discharge.  All patient questions were answered.   ____________________________________________  FINAL CLINICAL IMPRESSION(S) / ED DIAGNOSES  Final diagnoses:  Closed compression fracture of L2 lumbar vertebra, initial encounter ([place])      NEW MEDICATIONS STARTED DURING THIS VISIT:  ED Discharge Orders        Ordered    oxyCODONE-acetaminophen (PERCOCET/ROXICET) 5-325 MG tablet  Every 6 hours PRN     05/24/18 2001    methocarbamol (ROBAXIN) 500 MG tablet  Every 8 hours PRN     05/24/18 2001          This chart was dictated using voice recognition software/Dragon. Despite best efforts to proofread, errors can occur which can change the meaning. Any change was purely unintentional.    Karren Cobble 05/24/18 2048    Carrie Mew, MD 05/28/18 2045

## 2018-05-24 NOTE — ED Triage Notes (Signed)
Presents via ems s/p fall  States she missed last step on ladder and fell backwards  Having pain to lower back  Per ems she is having increased pain with movement

## 2018-05-27 ENCOUNTER — Ambulatory Visit
Admission: RE | Admit: 2018-05-27 | Discharge: 2018-05-27 | Disposition: A | Discharge status: Home / Self Care | Payer: Medicare Other | Source: Ambulatory Visit | Attending: Unknown Physician Specialty | Admitting: Unknown Physician Specialty

## 2018-05-27 ENCOUNTER — Other Ambulatory Visit: Payer: Self-pay | Admitting: Unknown Physician Specialty

## 2018-05-27 DIAGNOSIS — M5136 Other intervertebral disc degeneration, lumbar region: Secondary | ICD-10-CM | POA: Diagnosis not present

## 2018-05-27 DIAGNOSIS — X58XXXA Exposure to other specified factors, initial encounter: Secondary | ICD-10-CM | POA: Insufficient documentation

## 2018-05-27 DIAGNOSIS — S32020A Wedge compression fracture of second lumbar vertebra, initial encounter for closed fracture: Secondary | ICD-10-CM | POA: Insufficient documentation

## 2018-05-27 DIAGNOSIS — M48061 Spinal stenosis, lumbar region without neurogenic claudication: Secondary | ICD-10-CM | POA: Diagnosis not present

## 2018-05-30 ENCOUNTER — Encounter: Payer: Self-pay | Admitting: *Deleted

## 2018-05-30 ENCOUNTER — Ambulatory Visit
Admission: RE | Admit: 2018-05-30 | Discharge: 2018-05-30 | Disposition: A | Discharge status: Home / Self Care | Payer: Medicare Other | Source: Ambulatory Visit | Attending: Orthopedic Surgery | Admitting: Orthopedic Surgery

## 2018-05-30 ENCOUNTER — Ambulatory Visit: Payer: Medicare Other | Admitting: Anesthesiology

## 2018-05-30 ENCOUNTER — Ambulatory Visit: Payer: Medicare Other

## 2018-05-30 ENCOUNTER — Encounter
Admission: RE | Disposition: A | Discharge status: Home / Self Care | Payer: Self-pay | Source: Ambulatory Visit | Attending: Orthopedic Surgery

## 2018-05-30 ENCOUNTER — Other Ambulatory Visit: Payer: Self-pay

## 2018-05-30 DIAGNOSIS — G2581 Restless legs syndrome: Secondary | ICD-10-CM | POA: Diagnosis not present

## 2018-05-30 DIAGNOSIS — Z7951 Long term (current) use of inhaled steroids: Secondary | ICD-10-CM | POA: Insufficient documentation

## 2018-05-30 DIAGNOSIS — M479 Spondylosis, unspecified: Secondary | ICD-10-CM | POA: Diagnosis not present

## 2018-05-30 DIAGNOSIS — Z8249 Family history of ischemic heart disease and other diseases of the circulatory system: Secondary | ICD-10-CM | POA: Insufficient documentation

## 2018-05-30 DIAGNOSIS — R51 Headache: Secondary | ICD-10-CM | POA: Diagnosis not present

## 2018-05-30 DIAGNOSIS — S32020A Wedge compression fracture of second lumbar vertebra, initial encounter for closed fracture: Secondary | ICD-10-CM | POA: Insufficient documentation

## 2018-05-30 DIAGNOSIS — Z9049 Acquired absence of other specified parts of digestive tract: Secondary | ICD-10-CM | POA: Insufficient documentation

## 2018-05-30 DIAGNOSIS — M810 Age-related osteoporosis without current pathological fracture: Secondary | ICD-10-CM | POA: Insufficient documentation

## 2018-05-30 DIAGNOSIS — K589 Irritable bowel syndrome without diarrhea: Secondary | ICD-10-CM | POA: Diagnosis not present

## 2018-05-30 DIAGNOSIS — Z8261 Family history of arthritis: Secondary | ICD-10-CM | POA: Diagnosis not present

## 2018-05-30 DIAGNOSIS — Z833 Family history of diabetes mellitus: Secondary | ICD-10-CM | POA: Diagnosis not present

## 2018-05-30 DIAGNOSIS — Z8371 Family history of colonic polyps: Secondary | ICD-10-CM | POA: Diagnosis not present

## 2018-05-30 DIAGNOSIS — Y9389 Activity, other specified: Secondary | ICD-10-CM | POA: Insufficient documentation

## 2018-05-30 DIAGNOSIS — W11XXXA Fall on and from ladder, initial encounter: Secondary | ICD-10-CM | POA: Insufficient documentation

## 2018-05-30 DIAGNOSIS — Z823 Family history of stroke: Secondary | ICD-10-CM | POA: Insufficient documentation

## 2018-05-30 DIAGNOSIS — K225 Diverticulum of esophagus, acquired: Secondary | ICD-10-CM | POA: Insufficient documentation

## 2018-05-30 DIAGNOSIS — E785 Hyperlipidemia, unspecified: Secondary | ICD-10-CM | POA: Insufficient documentation

## 2018-05-30 DIAGNOSIS — M791 Myalgia, unspecified site: Secondary | ICD-10-CM | POA: Diagnosis not present

## 2018-05-30 DIAGNOSIS — K219 Gastro-esophageal reflux disease without esophagitis: Secondary | ICD-10-CM | POA: Diagnosis not present

## 2018-05-30 DIAGNOSIS — M170 Bilateral primary osteoarthritis of knee: Secondary | ICD-10-CM | POA: Insufficient documentation

## 2018-05-30 DIAGNOSIS — Z6834 Body mass index (BMI) 34.0-34.9, adult: Secondary | ICD-10-CM | POA: Insufficient documentation

## 2018-05-30 DIAGNOSIS — E669 Obesity, unspecified: Secondary | ICD-10-CM | POA: Diagnosis not present

## 2018-05-30 DIAGNOSIS — Z419 Encounter for procedure for purposes other than remedying health state, unspecified: Secondary | ICD-10-CM

## 2018-05-30 DIAGNOSIS — K449 Diaphragmatic hernia without obstruction or gangrene: Secondary | ICD-10-CM | POA: Diagnosis not present

## 2018-05-30 DIAGNOSIS — J45909 Unspecified asthma, uncomplicated: Secondary | ICD-10-CM | POA: Diagnosis not present

## 2018-05-30 DIAGNOSIS — G4730 Sleep apnea, unspecified: Secondary | ICD-10-CM | POA: Insufficient documentation

## 2018-05-30 DIAGNOSIS — Z79899 Other long term (current) drug therapy: Secondary | ICD-10-CM | POA: Insufficient documentation

## 2018-05-30 DIAGNOSIS — Z90710 Acquired absence of both cervix and uterus: Secondary | ICD-10-CM | POA: Insufficient documentation

## 2018-05-30 HISTORY — PX: KYPHOPLASTY: SHX5884

## 2018-05-30 HISTORY — DX: Myalgia, unspecified site: M79.10

## 2018-05-30 HISTORY — DX: Bilateral primary osteoarthritis of knee: M17.0

## 2018-05-30 SURGERY — KYPHOPLASTY
Anesthesia: General | Site: Back | Wound class: "Clean "

## 2018-05-30 MED ORDER — MEPERIDINE HCL 50 MG/ML IJ SOLN: 6.2500 mg | INTRAMUSCULAR | Status: DC | PRN

## 2018-05-30 MED ORDER — PROMETHAZINE HCL 25 MG/ML IJ SOLN: 6.2500 mg | INTRAMUSCULAR | Status: DC | PRN

## 2018-05-30 MED ORDER — OXYCODONE HCL 5 MG/5ML PO SOLN: 5.0000 mg | Freq: Once | ORAL | Status: DC | PRN

## 2018-05-30 MED ORDER — MIDAZOLAM HCL 2 MG/2ML IJ SOLN
INTRAMUSCULAR | Status: AC
  Filled 2018-05-30: qty 2

## 2018-05-30 MED ORDER — OXYCODONE HCL 5 MG PO TABS: 5.0000 mg | ORAL_TABLET | Freq: Once | ORAL | Status: DC | PRN

## 2018-05-30 MED ORDER — FENTANYL CITRATE (PF) 100 MCG/2ML IJ SOLN
INTRAMUSCULAR | Status: AC
  Filled 2018-05-30: qty 2

## 2018-05-30 MED ORDER — PROPOFOL 10 MG/ML IV BOLUS
INTRAVENOUS | Status: AC
  Filled 2018-05-30: qty 20

## 2018-05-30 MED ORDER — PROPOFOL 10 MG/ML IV BOLUS
INTRAVENOUS | Status: DC | PRN
  Administered 2018-05-30 (×3): 10 mg via INTRAVENOUS
  Administered 2018-05-30 (×2): 20 mg via INTRAVENOUS

## 2018-05-30 MED ORDER — CEFAZOLIN SODIUM-DEXTROSE 2-4 GM/100ML-% IV SOLN
2.0000 g | Freq: Once | INTRAVENOUS | Status: AC
  Administered 2018-05-30: 2 g via INTRAVENOUS

## 2018-05-30 MED ORDER — FENTANYL CITRATE (PF) 100 MCG/2ML IJ SOLN: 25.0000 ug | INTRAMUSCULAR | Status: DC | PRN

## 2018-05-30 MED ORDER — LACTATED RINGERS IV SOLN
INTRAVENOUS | Status: DC
  Administered 2018-05-30 (×2): via INTRAVENOUS

## 2018-05-30 MED ORDER — BUPIVACAINE-EPINEPHRINE (PF) 0.5% -1:200000 IJ SOLN
INTRAMUSCULAR | Status: DC | PRN
  Administered 2018-05-30: 15 mL

## 2018-05-30 MED ORDER — IOPAMIDOL (ISOVUE-M 200) INJECTION 41%
INTRAMUSCULAR | Status: AC
  Filled 2018-05-30: qty 20

## 2018-05-30 MED ORDER — LIDOCAINE HCL (PF) 1 % IJ SOLN
INTRAMUSCULAR | Status: AC
  Filled 2018-05-30: qty 30

## 2018-05-30 MED ORDER — HYDROCODONE-ACETAMINOPHEN 5-325 MG PO TABS: 1.0000 | ORAL_TABLET | Freq: Four times a day (QID) | ORAL | 0 refills | Status: DC | PRN

## 2018-05-30 MED ORDER — FENTANYL CITRATE (PF) 100 MCG/2ML IJ SOLN
INTRAMUSCULAR | Status: DC | PRN
  Administered 2018-05-30: 50 ug via INTRAVENOUS
  Administered 2018-05-30 (×2): 25 ug via INTRAVENOUS

## 2018-05-30 MED ORDER — CEFAZOLIN SODIUM-DEXTROSE 2-4 GM/100ML-% IV SOLN
INTRAVENOUS | Status: AC
  Filled 2018-05-30: qty 100

## 2018-05-30 MED ORDER — MIDAZOLAM HCL 2 MG/2ML IJ SOLN
INTRAMUSCULAR | Status: DC | PRN
  Administered 2018-05-30: 1 mg via INTRAVENOUS
  Administered 2018-05-30 (×2): 0.5 mg via INTRAVENOUS

## 2018-05-30 MED ORDER — BUPIVACAINE-EPINEPHRINE (PF) 0.5% -1:200000 IJ SOLN
INTRAMUSCULAR | Status: AC
  Filled 2018-05-30: qty 30

## 2018-05-30 MED ORDER — IOPAMIDOL (ISOVUE-M 200) INJECTION 41%
INTRAMUSCULAR | Status: DC | PRN
  Administered 2018-05-30: 20 mL

## 2018-05-30 MED ORDER — LIDOCAINE HCL 1 % IJ SOLN
INTRAMUSCULAR | Status: DC | PRN
  Administered 2018-05-30: 25 mL

## 2018-05-30 SURGICAL SUPPLY — 16 items
CEMENT KYPHON CX01A KIT/MIXER (Cement) ×2 IMPLANT
DERMABOND ADVANCED (GAUZE/BANDAGES/DRESSINGS) ×1
DERMABOND ADVANCED .7 DNX12 (GAUZE/BANDAGES/DRESSINGS) ×1 IMPLANT
DEVICE BIOPSY BONE KYPHX (INSTRUMENTS) ×2 IMPLANT
DRAPE C-ARM XRAY 36X54 (DRAPES) ×2 IMPLANT
DURAPREP 26ML APPLICATOR (WOUND CARE) ×2 IMPLANT
GLOVE SURG SYN 9.0  PF PI (GLOVE) ×1
GLOVE SURG SYN 9.0 PF PI (GLOVE) ×1 IMPLANT
GOWN SRG 2XL LVL 4 RGLN SLV (GOWNS) ×1 IMPLANT
GOWN STRL NON-REIN 2XL LVL4 (GOWNS) ×1
GOWN STRL REUS W/ TWL LRG LVL3 (GOWN DISPOSABLE) ×1 IMPLANT
GOWN STRL REUS W/TWL LRG LVL3 (GOWN DISPOSABLE) ×1
PACK KYPHOPLASTY (MISCELLANEOUS) ×2 IMPLANT
STRAP SAFETY 5IN WIDE (MISCELLANEOUS) ×2 IMPLANT
TRAY KYPHOPAK 15/3 EXPRESS 1ST (MISCELLANEOUS) ×2 IMPLANT
TRAY KYPHOPAK 20/3 EXPRESS 1ST (MISCELLANEOUS) ×1 IMPLANT

## 2018-05-30 NOTE — Anesthesia Post-op Follow-up Note (Signed)
Anesthesia QCDR form completed.        

## 2018-05-30 NOTE — Transfer of Care (Signed)
Immediate Anesthesia Transfer of Care Note  Patient: Diana Ray  Procedure(s) Performed: Hewitt Shorts (N/A Back)  Patient Location: PACU  Anesthesia Type:General  Level of Consciousness: awake  Airway & Oxygen Therapy: Patient Spontanous Breathing  Post-op Assessment: Report given to RN  Post vital signs: stable  Last Vitals:  Vitals Value Taken Time  BP 122/70 05/30/2018 12:53 PM  Temp    Pulse 77 05/30/2018 12:54 PM  Resp 24 05/30/2018 12:54 PM  SpO2 100 % 05/30/2018 12:54 PM  Vitals shown include unvalidated device data.  Last Pain:  Vitals:   05/30/18 1033  TempSrc: Temporal  PainSc: 6          Complications: No apparent anesthesia complications

## 2018-05-30 NOTE — Anesthesia Postprocedure Evaluation (Signed)
Anesthesia Post Note  Patient: Diana Ray  Procedure(s) Performed: Hewitt Shorts (N/A Back)  Patient location during evaluation: PACU Anesthesia Type: General Level of consciousness: awake and alert and oriented Pain management: pain level controlled Vital Signs Assessment: post-procedure vital signs reviewed and stable Respiratory status: spontaneous breathing, nonlabored ventilation and respiratory function stable Cardiovascular status: blood pressure returned to baseline and stable Postop Assessment: no signs of nausea or vomiting Anesthetic complications: no     Last Vitals:  Vitals:   05/30/18 1353 05/30/18 1404  BP: (!) 106/55 130/77  Pulse: 74 82  Resp: 12 18  Temp: 36.5 C 36.6 C  SpO2: 95% 100%    Last Pain:  Vitals:   05/30/18 1404  TempSrc: Temporal  PainSc: 0-No pain                 Jericho Alcorn

## 2018-05-30 NOTE — Op Note (Signed)
05/30/2018  1:03 PM  PATIENT:  Diana Ray  67 y.o. female  PRE-OPERATIVE DIAGNOSIS:  L2 COMPRESSION FRACTURE  POST-OPERATIVE DIAGNOSIS:  L2 COMPRESSION FRACTUR  PROCEDURE:  Procedure(s): KYPHOPLASTY-L2 (N/A)  SURGEON: Laurene Footman, MD  ASSISTANTS: None  ANESTHESIA:   local and MAC  EBL:  Total I/O In: 400 [I.V.:200; IV Piggyback:200] Out: 5 [Blood:5]  BLOOD ADMINISTERED:none  DRAINS: none   LOCAL MEDICATIONS USED:  MARCAINE    and XYLOCAINE   SPECIMEN:  No Specimen  DISPOSITION OF SPECIMEN:  N/A  COUNTS:  YES  TOURNIQUET:  * No tourniquets in log *  IMPLANTS: Bone cement  DICTATION: .Dragon Dictation patient was brought to the operating room and after adequate sedation was given she was placed prone.  Serum was brought into good visualization of the affected L2 vertebral body was obtained in both AP and lateral projections with C-arm.  After patient identification and timeout procedures were completed, local anesthetic was placed on both sides with 5 cc 1% Xylocaine infiltrated subcutaneously.  The back was then prepped and draped in the usual sterile manner and repeat timeout procedure carried out.  A spinal needle was used on the right side getting down to the pedicle and a 50-50 mix of 1% Xylocaine half percent Sensorcaine with epinephrine was infiltrated along the tract from the bone to the skin with a total of 30 cc used.  After allowing this to set a small incision was made and a trocar advanced in an extrapedicular fashion.  No biopsy was obtained with attempted biopsy within the bone was quite soft.  Next drilling was carried out and balloon inserted and inflated to 4 cc.  With the cement mixed in the appropriate consistency 6 cc of cement was infiltrated in the vertebral body with a very small amount extravasating anteriorly appeared to be contained adjacent to the bone and not the blood vessel.  After getting good fill from left to right superior to inferior  endplate the trochars removed and permanent C arm views obtained were closed with Dermabond and covered with a Band-Aid  PLAN OF CARE: Discharge to home after PACU  PATIENT DISPOSITION:  PACU - hemodynamically stable.

## 2018-05-30 NOTE — Anesthesia Procedure Notes (Signed)
Procedure Name: MAC Date/Time: 05/30/2018 12:15 PM Performed by: Allean Found, CRNA Pre-anesthesia Checklist: Patient identified, Emergency Drugs available, Suction available, Patient being monitored and Timeout performed Patient Re-evaluated:Patient Re-evaluated prior to induction Oxygen Delivery Method: Nasal cannula Preoxygenation: Pre-oxygenation with 100% oxygen Induction Type: IV induction Placement Confirmation: positive ETCO2

## 2018-05-30 NOTE — H&P (Signed)
Reviewed paper H+P, will be scanned into chart. No changes noted.  

## 2018-05-30 NOTE — Discharge Instructions (Addendum)
AMBULATORY SURGERY  DISCHARGE INSTRUCTIONS   1) The drugs that you were given will stay in your system until tomorrow so for the next 24 hours you should not:  A) Drive an automobile B) Make any legal decisions C) Drink any alcoholic beverage   2) You may resume regular meals tomorrow.  Today it is better to start with liquids and gradually work up to solid foods.  You may eat anything you prefer, but it is better to start with liquids, then soup and crackers, and gradually work up to solid foods.   3) Please notify your doctor immediately if you have any unusual bleeding, trouble breathing, redness and pain at the surgery site, drainage, fever, or pain not relieved by medication.    4) Additional Instructions:        Please contact your physician with any problems or Same Day Surgery at [phone], Monday through Friday 6 am to 4 pm, or [place] at [facility] number at [phone].Take it easy today and tomorrow, resume normal activities starting on Saturday but no climbing stepladder.  Remove Band-Aid on Saturday and then okay to shower, do not need to reapply Band-Aid.  Pain medicine as directed.

## 2018-05-30 NOTE — Anesthesia Preprocedure Evaluation (Signed)
Anesthesia Evaluation  Patient identified by MRN, date of birth, ID band Patient awake    Reviewed: Allergy & Precautions, NPO status , Patient's Chart, lab work & pertinent test results  History of Anesthesia Complications Negative for: history of anesthetic complications  Airway Mallampati: III  TM Distance: >3 FB Neck ROM: Full    Dental no notable dental hx.    Pulmonary asthma , neg sleep apnea,    breath sounds clear to auscultation- rhonchi (-) wheezing      Cardiovascular Exercise Tolerance: Good (-) hypertension(-) CAD, (-) Past MI, (-) Cardiac Stents and (-) CABG  Rhythm:Regular Rate:Normal - Systolic murmurs and - Diastolic murmurs    Neuro/Psych  Headaches, negative psych ROS   GI/Hepatic Neg liver ROS, hiatal hernia, GERD  ,  Endo/Other  negative endocrine ROSneg diabetes  Renal/GU negative Renal ROS     Musculoskeletal  (+) Arthritis ,   Abdominal (+) + obese,   Peds  Hematology negative hematology ROS (+)   Anesthesia Other Findings Past Medical History: No date: Allergy No date: Arthritis     Comment:  both knees, both hands and lumbar spine. No date: Asthma No date: GERD (gastroesophageal reflux disease) No date: Headache, acute No date: Hernia, hiatal No date: IBS (irritable bowel syndrome) No date: Myalgia No date: Osteoarthritis of both knees No date: Osteoporosis No date: RLS (restless legs syndrome) No date: Sleep apnea   Reproductive/Obstetrics                             Anesthesia Physical Anesthesia Plan  ASA: II  Anesthesia Plan: General   Post-op Pain Management:    Induction: Intravenous  PONV Risk Score and Plan: 2 and Propofol infusion  Airway Management Planned: Natural Airway  Additional Equipment:   Intra-op Plan:   Post-operative Plan:   Informed Consent: I have reviewed the patients History and Physical, chart, labs and  discussed the procedure including the risks, benefits and alternatives for the proposed anesthesia with the patient or authorized representative who has indicated his/her understanding and acceptance.   Dental advisory given  Plan Discussed with: CRNA and Anesthesiologist  Anesthesia Plan Comments:         Anesthesia Quick Evaluation

## 2018-05-31 ENCOUNTER — Encounter: Payer: Self-pay | Admitting: Orthopedic Surgery

## 2018-10-10 ENCOUNTER — Other Ambulatory Visit: Payer: Self-pay | Admitting: Obstetrics & Gynecology

## 2018-10-10 DIAGNOSIS — Z1231 Encounter for screening mammogram for malignant neoplasm of breast: Secondary | ICD-10-CM

## 2018-11-07 ENCOUNTER — Ambulatory Visit
Admission: RE | Admit: 2018-11-07 | Discharge: 2018-11-07 | Disposition: A | Discharge status: Home / Self Care | Payer: Medicare Other | Source: Ambulatory Visit | Attending: Obstetrics & Gynecology | Admitting: Obstetrics & Gynecology

## 2018-11-07 DIAGNOSIS — Z1231 Encounter for screening mammogram for malignant neoplasm of breast: Secondary | ICD-10-CM | POA: Diagnosis not present

## 2019-05-12 ENCOUNTER — Other Ambulatory Visit: Payer: Self-pay | Admitting: Gastroenterology

## 2019-05-12 DIAGNOSIS — R1312 Dysphagia, oropharyngeal phase: Secondary | ICD-10-CM

## 2019-06-24 ENCOUNTER — Other Ambulatory Visit: Payer: Self-pay

## 2019-06-24 ENCOUNTER — Ambulatory Visit
Admission: RE | Admit: 2019-06-24 | Discharge: 2019-06-24 | Disposition: A | Discharge status: Home / Self Care | Payer: Medicare Other | Source: Ambulatory Visit | Attending: Gastroenterology | Admitting: Gastroenterology

## 2019-06-24 DIAGNOSIS — R1312 Dysphagia, oropharyngeal phase: Secondary | ICD-10-CM | POA: Diagnosis not present

## 2019-06-24 DIAGNOSIS — R1314 Dysphagia, pharyngoesophageal phase: Secondary | ICD-10-CM | POA: Insufficient documentation

## 2019-06-24 NOTE — Therapy (Addendum)
[place], [place], [address] Phone: [phone]   Fax:     Modified Barium Swallow  Patient Details  Name: Diana Ray MRN: [id number] Date of Birth: Jul 13, 1951 No data recorded  Encounter Date: 06/24/2019  End of Session - 06/24/19 1550    Visit Number  1    Number of Visits  1    Date for SLP Re-Evaluation  06/24/19    SLP Start Time  72    SLP Stop Time   1400    SLP Time Calculation (min)  60 min    Activity Tolerance  Patient tolerated treatment well       Past Medical History:  Diagnosis Date  . Allergy   . Arthritis    both knees, both hands and lumbar spine.  . Asthma   . GERD (gastroesophageal reflux disease)   . Headache, acute   . Hernia, hiatal   . IBS (irritable bowel syndrome)   . Myalgia   . Osteoarthritis of both knees   . Osteoporosis   . RLS (restless legs syndrome)   . Sleep apnea     Past Surgical History:  Procedure Laterality Date  . ABDOMINAL HYSTERECTOMY  2006  . CHOLECYSTECTOMY  2006  . COLONOSCOPY  2015  . ESOPHAGOGASTRODUODENOSCOPY    . ESOPHAGOGASTRODUODENOSCOPY (EGD) WITH PROPOFOL N/A 01/09/2018   Procedure: ESOPHAGOGASTRODUODENOSCOPY (EGD) WITH PROPOFOL;  Surgeon: Toledo, Benay Pike, MD;  Location: [facility] ENDOSCOPY;  Service: Gastroenterology;  Laterality: N/A;  . KYPHOPLASTY N/A 05/30/2018   Procedure: Hewitt Shorts;  Surgeon: Hessie Knows, MD;  Location: [facility] ORS;  Service: Orthopedics;  Laterality: N/A;  . LIPOMA EXCISION  05/2017   back of neck  . MENISCUS REPAIR Bilateral 2010,2011    There were no vitals filed for this visit.      Subjective: Patient behavior: (alertness, ability to follow instructions, etc.): pt pleasant, verbally engaging giving description of medical history and difficulty w/ oral intake: inconsistent, s/s of coughing when eating certain foods including biscuits, french fries, and rice - "it feels like it is  just tickling my throat". Pt endorsed SIGNIFICANT H/O GERD w/ multiple Esophageal Dilitations, CP Bar dx, Zenker's Diverticulum dx, Hiatal Hernai. She is currently on Omeprazole. She is followed by GI. She denies weight loss; change in diet. Native Dentition; OM exam: wfl.  Chief complaint: dysphagia   Objective:  Radiological Procedure: A videoflouroscopic evaluation of oral-preparatory, reflex initiation, and pharyngeal phases of the swallow was performed; as well as a screening of the upper esophageal phase.  I. POSTURE: upright II. VIEW: lateral III. COMPENSATORY STRATEGIES: strategies of f/u, dry swallowing and alternating boluses of food w/ liquid both to aid reducing Esophageal (Zenker's Div.) residue during study IV. BOLUSES ADMINISTERED:  Thin Liquid: 7-8 trials - some multiple sips (min larger boluses inconsistently)  Nectar-thick Liquid: 1 trial  Honey-thick Liquid: NT  Puree: 3 trials  Mechanical Soft: 2 trials V. RESULTS OF EVALUATION: A. ORAL PREPARATORY PHASE: (The lips, tongue, and velum are observed for strength and coordination)       **Overall Severity Rating: WFL. Adequate and timely bolus management w/ appropriate A-P transfer followed by full oral clearing w/ all trial consistencies.   B. SWALLOW INITIATION/REFLEX: (The reflex is normal if "triggered" by the time the bolus reached the base of the tongue)  **Overall Severity Rating: [facility]. Timely pharyngeal swallow initiation w/ complete airway closure during the swallow w/ all trial consistencies.   C. PHARYNGEAL  PHASE: (Pharyngeal function is normal if the bolus shows rapid, smooth, and continuous transit through the pharynx and there is no pharyngeal residue after the swallow)  **Overall Severity Rating: [facility]. Full pharyngeal clearing(no pharyngeal residue) post swallows indicating adequate laryngeal excursion and pharyngeal pressure during the swallow w/ all trial consistencies.   D. LARYNGEAL PENETRATION: (Material  entering into the laryngeal inlet/vestibule but not aspirated): NONE E. ASPIRATION: NONE F. ESOPHAGEAL PHASE: (Screening of the upper esophagus): ZENKER'S DIVERTICULUM W/ BOLUS RETENTION AND RETROGRADE ACTIVITY OF BOLUS MATERIAL TO THE UES/PYRIFORM SINUSES.   ASSESSMENT:  Pt appears to present w/ NO oropharyngeal phase dysphagia as evidenced by the results of this study today. However, this study does demonstrate the presence of a ZENKER'S DIVERTICULUM W/ BOLUS RETENTION AND RETROGRADE ACTIVITY OF BOLUS MATERIAL TO THE UES/PYRIFORM SINUSES(slight amount). This can significantly increase pt's risk for Penetration and/or Aspiration of the Retrograde flowing material from the Zenker's Diverticulum between boluses during oral intake. Pt was able to use strategies to aid in reducing the amount of bolus retention in the Zenker's Diverticulum during po trials to include use of  f/u, dry swallowing, and alternating boluses of food w/ liquid - both appeared advantageous to reducing bolus retention in the diverticulum during the study. However, these strategies do not resolve this situation nor do they ensure no aspiration or discomfort will occur during oral intake. With any chronic aspiration, Pulmonary impact can occur from the negative sequelae of aspiration.  During the oral phase, No deficits were noted. Adequate and timely bolus management w/ appropriate A-P transfer followed by full oral clearing w/ all trial consistencies. During the pharyngeal phase, No deficits were noted. Timely pharyngeal swallow initiation w/ complete airway closure during the swallow w/ all trial consistencies. Full pharyngeal clearing(no pharyngeal residue) post swallows indicating adequate laryngeal excursion and pharyngeal pressure during the swallow w/ all trial consistencies.  During the study and after, the strategies were practiced and discussed; questions answered as best possible. Encouraged pt to f/u w/ GI then potential consult  w/ ENT to learn more about a Zenker's Diverticulum, prognosis and treatment. Encouraged pt to keep a food diary of the certain foods that are more "problematic" w/ consideration of reducing those foods in her diet if necessary.  Discuss w/ MDs the safest and best avenue for swallowing Medications in light of having a Zenker's Diverticulum.   PLAN/RECOMMENDATIONS:  A. Diet: Regular diet w/ foods cut small, moistened; Thin liquids.   B. Swallowing Precautions: general REFLUX and aspiration precautions. Strategies to use: f/u, Dry swallow and Alternate food/liquids during meals  C. Recommended consultation to: GI, ENT for further assessment and management  D. Therapy recommendations: None  E. Results and recommendations were discussed w/ patient; questions answered; strategies practiced and discussed fully. Radiologist present.            Dysphagia, pharyngoesophageal phase; ESOPHAGEAL PHASE  Oropharyngeal dysphagia - Plan: DG SWALLOW FUNC OP MEDICARE SPEECH PATH, DG SWALLOW FUNC OP MEDICARE SPEECH PATH        Problem List Patient Active Problem List   Diagnosis Date Noted  . Lipoma of neck 05/21/2017          Orinda Kenner, MS, CCC-SLP Aidynn Polendo 06/24/2019, 3:52 PM  [facility] [place], [place], [address] Phone: [phone]   Fax:     Name: Diana Ray MRN: [id number] Date of Birth: 1951/02/18

## 2019-07-08 ENCOUNTER — Ambulatory Visit: Admit: 2019-07-08 | Payer: Medicare Other | Admitting: Gastroenterology

## 2019-07-08 SURGERY — COLONOSCOPY WITH PROPOFOL
Anesthesia: General

## 2019-07-18 ENCOUNTER — Other Ambulatory Visit
Admission: RE | Admit: 2019-07-18 | Discharge: 2019-07-18 | Disposition: A | Discharge status: Home / Self Care | Payer: Medicare Other | Source: Ambulatory Visit | Attending: Gastroenterology | Admitting: Gastroenterology

## 2019-07-18 DIAGNOSIS — Z01812 Encounter for preprocedural laboratory examination: Secondary | ICD-10-CM | POA: Insufficient documentation

## 2019-07-18 DIAGNOSIS — Z20828 Contact with and (suspected) exposure to other viral communicable diseases: Secondary | ICD-10-CM | POA: Insufficient documentation

## 2019-07-19 LAB — SARS CORONAVIRUS 2 (TAT 6-24 HRS): SARS Coronavirus 2: NEGATIVE

## 2019-07-22 ENCOUNTER — Ambulatory Visit: Payer: Medicare Other | Admitting: Anesthesiology

## 2019-07-22 ENCOUNTER — Other Ambulatory Visit: Payer: Self-pay

## 2019-07-22 ENCOUNTER — Ambulatory Visit
Admission: RE | Admit: 2019-07-22 | Discharge: 2019-07-22 | Disposition: A | Discharge status: Home / Self Care | Payer: Medicare Other | Attending: Gastroenterology | Admitting: Gastroenterology

## 2019-07-22 ENCOUNTER — Encounter
Admission: RE | Disposition: A | Discharge status: Home / Self Care | Payer: Self-pay | Source: Home / Self Care | Attending: Gastroenterology

## 2019-07-22 ENCOUNTER — Encounter: Payer: Self-pay | Admitting: Anesthesiology

## 2019-07-22 DIAGNOSIS — G4730 Sleep apnea, unspecified: Secondary | ICD-10-CM | POA: Insufficient documentation

## 2019-07-22 DIAGNOSIS — K621 Rectal polyp: Secondary | ICD-10-CM | POA: Insufficient documentation

## 2019-07-22 DIAGNOSIS — M170 Bilateral primary osteoarthritis of knee: Secondary | ICD-10-CM | POA: Diagnosis not present

## 2019-07-22 DIAGNOSIS — Z8371 Family history of colonic polyps: Secondary | ICD-10-CM | POA: Diagnosis not present

## 2019-07-22 DIAGNOSIS — M479 Spondylosis, unspecified: Secondary | ICD-10-CM | POA: Diagnosis not present

## 2019-07-22 DIAGNOSIS — R194 Change in bowel habit: Secondary | ICD-10-CM | POA: Insufficient documentation

## 2019-07-22 DIAGNOSIS — K5730 Diverticulosis of large intestine without perforation or abscess without bleeding: Secondary | ICD-10-CM | POA: Insufficient documentation

## 2019-07-22 DIAGNOSIS — M19041 Primary osteoarthritis, right hand: Secondary | ICD-10-CM | POA: Insufficient documentation

## 2019-07-22 DIAGNOSIS — Z7982 Long term (current) use of aspirin: Secondary | ICD-10-CM | POA: Diagnosis not present

## 2019-07-22 DIAGNOSIS — Z79899 Other long term (current) drug therapy: Secondary | ICD-10-CM | POA: Insufficient documentation

## 2019-07-22 DIAGNOSIS — K589 Irritable bowel syndrome without diarrhea: Secondary | ICD-10-CM | POA: Insufficient documentation

## 2019-07-22 DIAGNOSIS — M19042 Primary osteoarthritis, left hand: Secondary | ICD-10-CM | POA: Diagnosis not present

## 2019-07-22 DIAGNOSIS — J45909 Unspecified asthma, uncomplicated: Secondary | ICD-10-CM | POA: Diagnosis not present

## 2019-07-22 DIAGNOSIS — D123 Benign neoplasm of transverse colon: Secondary | ICD-10-CM | POA: Insufficient documentation

## 2019-07-22 DIAGNOSIS — K449 Diaphragmatic hernia without obstruction or gangrene: Secondary | ICD-10-CM | POA: Insufficient documentation

## 2019-07-22 DIAGNOSIS — Z7951 Long term (current) use of inhaled steroids: Secondary | ICD-10-CM | POA: Diagnosis not present

## 2019-07-22 DIAGNOSIS — K219 Gastro-esophageal reflux disease without esophagitis: Secondary | ICD-10-CM | POA: Diagnosis not present

## 2019-07-22 DIAGNOSIS — D122 Benign neoplasm of ascending colon: Secondary | ICD-10-CM | POA: Diagnosis not present

## 2019-07-22 HISTORY — DX: Diverticulum of esophagus, acquired: K22.5

## 2019-07-22 HISTORY — PX: COLONOSCOPY WITH PROPOFOL: SHX5780

## 2019-07-22 SURGERY — COLONOSCOPY WITH PROPOFOL
Anesthesia: General

## 2019-07-22 MED ORDER — PROPOFOL 500 MG/50ML IV EMUL
INTRAVENOUS | Status: DC | PRN
  Administered 2019-07-22: 175 ug/kg/min via INTRAVENOUS

## 2019-07-22 MED ORDER — PROPOFOL 10 MG/ML IV BOLUS
INTRAVENOUS | Status: DC | PRN
  Administered 2019-07-22: 60 mg via INTRAVENOUS

## 2019-07-22 MED ORDER — SODIUM CHLORIDE 0.9 % IV SOLN
INTRAVENOUS | Status: DC
  Administered 2019-07-22: 11:00:00 via INTRAVENOUS

## 2019-07-22 MED ORDER — LIDOCAINE HCL (CARDIAC) PF 100 MG/5ML IV SOSY
PREFILLED_SYRINGE | INTRAVENOUS | Status: DC | PRN
  Administered 2019-07-22: 50 mg via INTRAVENOUS

## 2019-07-22 MED ORDER — PROPOFOL 500 MG/50ML IV EMUL
INTRAVENOUS | Status: AC
  Filled 2019-07-22: qty 50

## 2019-07-22 MED ORDER — PHENYLEPHRINE HCL (PRESSORS) 10 MG/ML IV SOLN
INTRAVENOUS | Status: DC | PRN
  Administered 2019-07-22: 100 ug via INTRAVENOUS

## 2019-07-22 NOTE — Anesthesia Preprocedure Evaluation (Signed)
Anesthesia Evaluation  Patient identified by MRN, date of birth, ID band Patient awake    Reviewed: Allergy & Precautions, NPO status , Patient's Chart, lab work & pertinent test results, reviewed documented beta blocker date and time   Airway Mallampati: III  TM Distance: >3 FB     Dental  (+) Chipped   Pulmonary asthma , sleep apnea ,           Cardiovascular      Neuro/Psych  Headaches,    GI/Hepatic hiatal hernia, GERD  ,  Endo/Other    Renal/GU      Musculoskeletal  (+) Arthritis ,   Abdominal   Peds  Hematology   Anesthesia Other Findings IBS. RLS.  Reproductive/Obstetrics                             Anesthesia Physical Anesthesia Plan  ASA: III  Anesthesia Plan: General   Post-op Pain Management:    Induction: Intravenous  PONV Risk Score and Plan:   Airway Management Planned:   Additional Equipment:   Intra-op Plan:   Post-operative Plan:   Informed Consent: I have reviewed the patients History and Physical, chart, labs and discussed the procedure including the risks, benefits and alternatives for the proposed anesthesia with the patient or authorized representative who has indicated his/her understanding and acceptance.       Plan Discussed with: CRNA  Anesthesia Plan Comments:         Anesthesia Quick Evaluation

## 2019-07-22 NOTE — Op Note (Signed)
[facility] Gastroenterology Patient Name: Diana Ray Procedure Date: 07/22/2019 11:13 AM MRN: [id number] Account #: [id number] Date of Birth: [birth year] Admit Type: Outpatient Age: 68 Room: [facility] ENDO ROOM 3 Gender: Female Note Status: Finalized Procedure:            Colonoscopy Indications:          Family history of colonic polyps in a first-degree                        relative Providers:            Lollie Sails, MD Referring MD:         Ocie Cornfield. Ouida Sills MD, MD (Referring MD) Medicines:            Monitored Anesthesia Care Complications:        No immediate complications. Procedure:            Pre-Anesthesia Assessment:                       - ASA Grade Assessment: II - A patient with mild                        systemic disease.                       After obtaining informed consent, the colonoscope was                        passed under direct vision. Throughout the procedure,                        the patient's blood pressure, pulse, and oxygen                        saturations were monitored continuously. The                        Colonoscope was introduced through the anus and                        advanced to the the cecum, identified by appendiceal                        orifice and ileocecal valve. The colonoscopy was                        performed with moderate difficulty due to a tortuous                        colon. Successful completion of the procedure was aided                        by changing the patient to a supine position, changing                        the patient to a prone position and using manual                        pressure. The patient tolerated the procedure well. The  quality of the bowel preparation was good. Findings:      A few small-mouthed diverticula were found in the sigmoid colon and       descending colon.      A 5 mm polyp was found in the transverse colon. The polyp  was sessile.       The polyp was removed with a cold snare. Resection and retrieval were       complete.      A 6 mm polyp was found in the hepatic flexure. The polyp was sessile.       The polyp was removed with a cold snare and cold forcep. The polyp was       removed with a lift and cut technique using a cold snare. Resection and       retrieval were complete.      A 2 mm polyp was found in the hepatic flexure. The polyp was sessile.       The polyp was removed with a cold biopsy forceps. Resection and       retrieval were complete.      Two sessile polyps were found in the ascending colon. The polyps were 5       to 6 mm in size. These polyps were removed with a cold snare. Resection       and retrieval were complete.      A 1 mm polyp was found in the rectum. The polyp was sessile. The polyp       was removed with a cold biopsy forceps. Resection and retrieval were       complete.      No additional abnormalities were found on retroflexion.      The digital rectal exam was normal. Impression:           - Diverticulosis in the sigmoid colon and in the                        descending colon.                       - One 5 mm polyp in the transverse colon, removed with                        a cold snare. Resected and retrieved.                       - One 6 mm polyp at the hepatic flexure, removed with a                        cold snare and removed using lift and cut and a cold                        snare. Resected and retrieved.                       - One 2 mm polyp at the hepatic flexure, removed with a                        cold biopsy forceps. Resected and retrieved.                       - Two 5 to 6 mm polyps in the  ascending colon, removed                        with a cold snare. Resected and retrieved.                       - One 1 mm polyp in the rectum, removed with a cold                        biopsy forceps. Resected and retrieved. Recommendation:       - Discharge  patient to home.                       - Await pathology results.                       - Telephone GI clinic for pathology results in 5 days. Procedure Code(s):    --- Professional ---                       [phone], Colonoscopy, flexible; with removal of tumor(s),                        polyp(s), or other lesion(s) by snare technique                       [id number], 57, Colonoscopy, flexible; with biopsy, single                        or multiple Diagnosis Code(s):    --- Professional ---                       K63.5, Polyp of colon                       K62.1, Rectal polyp                       Z83.71, Family history of colonic polyps                       K57.30, Diverticulosis of large intestine without                        perforation or abscess without bleeding CPT copyright 2019 American Medical Association. All rights reserved. The codes documented in this report are preliminary and upon coder review may  be revised to meet current compliance requirements. Lollie Sails, MD 07/22/2019 12:16:44 PM This report has been signed electronically. Number of Addenda: 0 Note Initiated On: 07/22/2019 11:13 AM Scope Withdrawal Time: 0 hours 5 minutes 20 seconds  Total Procedure Duration: 0 hours 38 minutes 4 seconds       [facility]

## 2019-07-22 NOTE — Anesthesia Procedure Notes (Signed)
Date/Time: 07/22/2019 10:25 AM Performed by: Johnna Acosta, CRNA Pre-anesthesia Checklist: Patient identified, Emergency Drugs available, Suction available, Timeout performed and Patient being monitored Patient Re-evaluated:Patient Re-evaluated prior to induction Oxygen Delivery Method: Nasal cannula and Supernova nasal CPAP Preoxygenation: Pre-oxygenation with 100% oxygen Induction Type: IV induction and Inhalational induction

## 2019-07-22 NOTE — Anesthesia Post-op Follow-up Note (Signed)
Anesthesia QCDR form completed.        

## 2019-07-22 NOTE — Anesthesia Postprocedure Evaluation (Signed)
Anesthesia Post Note  Patient: Diana Ray  Procedure(s) Performed: COLONOSCOPY WITH PROPOFOL (N/A )  Patient location during evaluation: Endoscopy Anesthesia Type: General Level of consciousness: awake and alert Pain management: pain level controlled Vital Signs Assessment: post-procedure vital signs reviewed and stable Respiratory status: spontaneous breathing, nonlabored ventilation, respiratory function stable and patient connected to nasal cannula oxygen Cardiovascular status: blood pressure returned to baseline and stable Postop Assessment: no apparent nausea or vomiting Anesthetic complications: no     Last Vitals:  Vitals:   07/22/19 1224 07/22/19 1234  BP: 98/66 112/74  Pulse: 79 69  Resp: 20 20  Temp:    SpO2: 99% 100%    Last Pain:  Vitals:   07/22/19 1234  TempSrc:   PainSc: 0-No pain                 Hilde Churchman S

## 2019-07-22 NOTE — H&P (Signed)
Outpatient short stay form Pre-procedure 07/22/2019 11:20 AM Diana Sails MD  Primary Physician: Dr. Frazier Richards  Reason for visit: Colonoscopy  History of present illness: Patient is a 68 year old female presenting today for colonoscopy in regards to change in bowel habits and family history of colon polyps in primary relative, father.  She had a recent episode in June 2020 of C. difficile.  Her bowel habits have improved much since then although still has some loose stools at times.  She has tested negative for C. difficile since then as well as other infective organisms.  Tolerated her prep well.  She denies any blood in the stool or abdominal pain.    Current Facility-Administered Medications:  .  0.9 %  sodium chloride infusion, , Intravenous, Continuous, Diana Sails, MD, Last Rate: 20 mL/hr at 07/22/19 1048  Medications Prior to Admission  Medication Sig Dispense Refill Last Dose  . Aspirin-Acetaminophen-Caffeine (EXCEDRIN PO) Take 1 tablet by mouth daily as needed.   Past Week at Unknown time  . ergocalciferol (VITAMIN D2) 50000 units capsule Take 50,000 Units by mouth once a week.   Past Week at Unknown time  . Fluticasone-Salmeterol (ADVAIR DISKUS) 250-50 MCG/DOSE AEPB INHALE ONE PUFF INTO THE LUNGS EVERY TWELVE HOURS   Past Month at Unknown time  . albuterol (PROVENTIL HFA;VENTOLIN HFA) 108 (90 Base) MCG/ACT inhaler Inhale 2 puffs into the lungs every 6 (six) hours as needed for wheezing or shortness of breath.     . carbidopa-levodopa (SINEMET IR) 25-100 MG tablet TAKE 1 OR 2 TABLETS BY MOUTH nightly  3 07/20/2019  . cetirizine (ZYRTEC) 10 MG tablet Take 10 mg by mouth daily as needed.    07/20/2019  . DULoxetine (CYMBALTA) 30 MG capsule Take 30 mg by mouth daily.  12 07/20/2019  . HYDROcodone-acetaminophen (NORCO) 5-325 MG tablet Take 1 tablet by mouth every 6 (six) hours as needed for moderate pain. 15 tablet 0   . methocarbamol (ROBAXIN) 500 MG tablet Take 500 mg  by mouth 3 (three) times daily.     . Multiple Vitamin (MULTIVITAMIN) tablet Take 1 tablet by mouth daily.   07/15/2019  . omeprazole (PRILOSEC) 20 MG capsule Take 1 tablet by mouth once daily. Take 30 min before meals.  11 07/15/2019     No Known Allergies   Past Medical History:  Diagnosis Date  . Allergy   . Arthritis    both knees, both hands and lumbar spine.  . Asthma   . GERD (gastroesophageal reflux disease)   . Headache, acute   . Hernia, hiatal   . IBS (irritable bowel syndrome)   . Myalgia   . Osteoarthritis of both knees   . Osteoporosis   . RLS (restless legs syndrome)   . Sleep apnea   . Zenker's diverticulum     Review of systems:      Physical Exam    Heart and lungs: Regular rate and rhythm without rub or gallop lungs are bilaterally clear    HEENT: Normocephalic atraumatic eyes are anicteric    Other:    Pertinant exam for procedure: Soft nontender nondistended bowel sounds positive normoactive    Planned proceedures: Colonoscopy and indicated procedures. I have discussed the risks benefits and complications of procedures to include not limited to bleeding, infection, perforation and the risk of sedation and the patient wishes to proceed.    Diana Sails, MD Gastroenterology 07/22/2019  11:20 AM

## 2019-07-22 NOTE — Anesthesia Procedure Notes (Signed)
Date/Time: 07/22/2019 11:26 AM Performed by: Johnna Acosta, CRNA Pre-anesthesia Checklist: Patient identified, Emergency Drugs available, Suction available, Patient being monitored and Timeout performed Patient Re-evaluated:Patient Re-evaluated prior to induction Oxygen Delivery Method: Nasal cannula Preoxygenation: Pre-oxygenation with 100% oxygen Induction Type: IV induction

## 2019-07-22 NOTE — Transfer of Care (Signed)
Immediate Anesthesia Transfer of Care Note  Patient: Diana Ray  Procedure(s) Performed: COLONOSCOPY WITH PROPOFOL (N/A )  Patient Location: PACU  Anesthesia Type:General  Level of Consciousness: sedated  Airway & Oxygen Therapy: Patient Spontanous Breathing  Post-op Assessment: Report given to RN and Post -op Vital signs reviewed and stable  Post vital signs: Reviewed and stable  Last Vitals:  Vitals Value Taken Time  BP 87/53 07/22/19 1214  Temp 36.2 C 07/22/19 1214  Pulse 87 07/22/19 1214  Resp 20 07/22/19 1214  SpO2 96 % 07/22/19 1214    Last Pain:  Vitals:   07/22/19 1214  TempSrc: Tympanic  PainSc: Asleep         Complications: No apparent anesthesia complications

## 2019-07-23 ENCOUNTER — Encounter: Payer: Self-pay | Admitting: Gastroenterology

## 2019-07-23 LAB — SURGICAL PATHOLOGY

## 2019-08-07 ENCOUNTER — Other Ambulatory Visit: Payer: Self-pay | Admitting: Otolaryngology

## 2019-08-07 ENCOUNTER — Other Ambulatory Visit (HOSPITAL_COMMUNITY): Payer: Self-pay | Admitting: Otolaryngology

## 2019-08-07 DIAGNOSIS — H90A22 Sensorineural hearing loss, unilateral, left ear, with restricted hearing on the contralateral side: Secondary | ICD-10-CM

## 2019-08-21 ENCOUNTER — Ambulatory Visit
Admission: RE | Admit: 2019-08-21 | Discharge: 2019-08-21 | Disposition: A | Discharge status: Home / Self Care | Payer: Medicare Other | Source: Ambulatory Visit | Attending: Otolaryngology | Admitting: Otolaryngology

## 2019-08-21 ENCOUNTER — Other Ambulatory Visit: Payer: Self-pay

## 2019-08-21 DIAGNOSIS — H90A22 Sensorineural hearing loss, unilateral, left ear, with restricted hearing on the contralateral side: Secondary | ICD-10-CM | POA: Diagnosis not present

## 2019-08-21 LAB — POCT I-STAT CREATININE: Creatinine, Ser: 0.7 mg/dL (ref 0.44–1.00)

## 2019-08-21 MED ORDER — GADOBUTROL 1 MMOL/ML IV SOLN
8.0000 mL | Freq: Once | INTRAVENOUS | Status: AC | PRN
  Administered 2019-08-21: 8 mL via INTRAVENOUS

## 2019-10-21 ENCOUNTER — Other Ambulatory Visit: Payer: Self-pay | Admitting: Obstetrics & Gynecology

## 2019-10-21 DIAGNOSIS — Z1231 Encounter for screening mammogram for malignant neoplasm of breast: Secondary | ICD-10-CM

## 2019-11-13 ENCOUNTER — Ambulatory Visit
Admission: RE | Admit: 2019-11-13 | Discharge: 2019-11-13 | Disposition: A | Discharge status: Home / Self Care | Payer: Medicare PPO | Source: Ambulatory Visit | Attending: Obstetrics & Gynecology | Admitting: Obstetrics & Gynecology

## 2019-11-13 DIAGNOSIS — Z1231 Encounter for screening mammogram for malignant neoplasm of breast: Secondary | ICD-10-CM

## 2019-11-23 ENCOUNTER — Emergency Department: Payer: Medicare PPO

## 2019-11-23 ENCOUNTER — Other Ambulatory Visit: Payer: Self-pay

## 2019-11-23 ENCOUNTER — Inpatient Hospital Stay
Admission: EM | Admit: 2019-11-23 | Discharge: 2019-11-25 | DRG: 440 | Disposition: A | Discharge status: Home / Self Care | Payer: Medicare PPO | Attending: Internal Medicine | Admitting: Internal Medicine

## 2019-11-23 DIAGNOSIS — Z23 Encounter for immunization: Secondary | ICD-10-CM | POA: Diagnosis not present

## 2019-11-23 DIAGNOSIS — K589 Irritable bowel syndrome without diarrhea: Secondary | ICD-10-CM | POA: Diagnosis present

## 2019-11-23 DIAGNOSIS — K8500 Idiopathic acute pancreatitis without necrosis or infection: Secondary | ICD-10-CM | POA: Diagnosis not present

## 2019-11-23 DIAGNOSIS — K5900 Constipation, unspecified: Secondary | ICD-10-CM | POA: Diagnosis present

## 2019-11-23 DIAGNOSIS — Z9049 Acquired absence of other specified parts of digestive tract: Secondary | ICD-10-CM | POA: Diagnosis not present

## 2019-11-23 DIAGNOSIS — J45909 Unspecified asthma, uncomplicated: Secondary | ICD-10-CM | POA: Diagnosis present

## 2019-11-23 DIAGNOSIS — Z8249 Family history of ischemic heart disease and other diseases of the circulatory system: Secondary | ICD-10-CM

## 2019-11-23 DIAGNOSIS — Z7951 Long term (current) use of inhaled steroids: Secondary | ICD-10-CM | POA: Diagnosis not present

## 2019-11-23 DIAGNOSIS — K219 Gastro-esophageal reflux disease without esophagitis: Secondary | ICD-10-CM | POA: Diagnosis present

## 2019-11-23 DIAGNOSIS — Z20822 Contact with and (suspected) exposure to covid-19: Secondary | ICD-10-CM | POA: Diagnosis present

## 2019-11-23 DIAGNOSIS — Z90710 Acquired absence of both cervix and uterus: Secondary | ICD-10-CM

## 2019-11-23 DIAGNOSIS — K859 Acute pancreatitis without necrosis or infection, unspecified: Secondary | ICD-10-CM | POA: Diagnosis present

## 2019-11-23 DIAGNOSIS — K760 Fatty (change of) liver, not elsewhere classified: Secondary | ICD-10-CM | POA: Diagnosis present

## 2019-11-23 DIAGNOSIS — G2581 Restless legs syndrome: Secondary | ICD-10-CM | POA: Diagnosis present

## 2019-11-23 DIAGNOSIS — J302 Other seasonal allergic rhinitis: Secondary | ICD-10-CM | POA: Diagnosis present

## 2019-11-23 DIAGNOSIS — R17 Unspecified jaundice: Secondary | ICD-10-CM | POA: Diagnosis not present

## 2019-11-23 DIAGNOSIS — M797 Fibromyalgia: Secondary | ICD-10-CM | POA: Diagnosis present

## 2019-11-23 DIAGNOSIS — Z79899 Other long term (current) drug therapy: Secondary | ICD-10-CM | POA: Diagnosis not present

## 2019-11-23 DIAGNOSIS — M810 Age-related osteoporosis without current pathological fracture: Secondary | ICD-10-CM | POA: Diagnosis present

## 2019-11-23 DIAGNOSIS — E876 Hypokalemia: Secondary | ICD-10-CM | POA: Diagnosis present

## 2019-11-23 DIAGNOSIS — E669 Obesity, unspecified: Secondary | ICD-10-CM | POA: Diagnosis present

## 2019-11-23 DIAGNOSIS — Z6837 Body mass index (BMI) 37.0-37.9, adult: Secondary | ICD-10-CM | POA: Diagnosis not present

## 2019-11-23 DIAGNOSIS — K5750 Diverticulosis of both small and large intestine without perforation or abscess without bleeding: Secondary | ICD-10-CM | POA: Diagnosis present

## 2019-11-23 DIAGNOSIS — R7989 Other specified abnormal findings of blood chemistry: Secondary | ICD-10-CM | POA: Diagnosis not present

## 2019-11-23 DIAGNOSIS — G4730 Sleep apnea, unspecified: Secondary | ICD-10-CM | POA: Diagnosis present

## 2019-11-23 DIAGNOSIS — R1011 Right upper quadrant pain: Secondary | ICD-10-CM

## 2019-11-23 DIAGNOSIS — Z7289 Other problems related to lifestyle: Secondary | ICD-10-CM | POA: Diagnosis not present

## 2019-11-23 LAB — URINALYSIS, COMPLETE (UACMP) WITH MICROSCOPIC
Glucose, UA: NEGATIVE mg/dL
Hgb urine dipstick: NEGATIVE
Ketones, ur: 5 mg/dL — AB
Leukocytes,Ua: NEGATIVE
Nitrite: NEGATIVE
Protein, ur: 30 mg/dL — AB
Specific Gravity, Urine: 1.015 (ref 1.005–1.030)
pH: 6 (ref 5.0–8.0)

## 2019-11-23 LAB — RESPIRATORY PANEL BY RT PCR (FLU A&B, COVID)
Influenza A by PCR: NEGATIVE
Influenza B by PCR: NEGATIVE
SARS Coronavirus 2 by RT PCR: NEGATIVE

## 2019-11-23 LAB — CBC
HCT: 41.4 % (ref 36.0–46.0)
Hemoglobin: 13.9 g/dL (ref 12.0–15.0)
MCH: 30.7 pg (ref 26.0–34.0)
MCHC: 33.6 g/dL (ref 30.0–36.0)
MCV: 91.4 fL (ref 80.0–100.0)
Platelets: 241 10*3/uL (ref 150–400)
RBC: 4.53 MIL/uL (ref 3.87–5.11)
RDW: 13.1 % (ref 11.5–15.5)
WBC: 7.9 10*3/uL (ref 4.0–10.5)
nRBC: 0 % (ref 0.0–0.2)

## 2019-11-23 LAB — BASIC METABOLIC PANEL
Anion gap: 10 (ref 5–15)
BUN: 8 mg/dL (ref 8–23)
CO2: 26 mmol/L (ref 22–32)
Calcium: 8.7 mg/dL — ABNORMAL LOW (ref 8.9–10.3)
Chloride: 99 mmol/L (ref 98–111)
Creatinine, Ser: 0.65 mg/dL (ref 0.44–1.00)
GFR calc Af Amer: >60 mL/min (ref >60)
GFR calc non Af Amer: >60 mL/min (ref >60)
Glucose, Bld: 110 mg/dL — ABNORMAL HIGH (ref 70–99)
Potassium: 3.2 mmol/L — ABNORMAL LOW (ref 3.5–5.1)
Sodium: 135 mmol/L (ref 135–145)

## 2019-11-23 LAB — HEPATIC FUNCTION PANEL
ALT: 391 U/L — ABNORMAL HIGH (ref 0–44)
AST: 198 U/L — ABNORMAL HIGH (ref 15–41)
Albumin: 3.6 g/dL (ref 3.5–5.0)
Alkaline Phosphatase: 270 U/L — ABNORMAL HIGH (ref 38–126)
Bilirubin, Direct: 3.3 mg/dL — ABNORMAL HIGH (ref 0.0–0.2)
Indirect Bilirubin: 1.8 mg/dL — ABNORMAL HIGH (ref 0.3–0.9)
Total Bilirubin: 5.1 mg/dL — ABNORMAL HIGH (ref 0.3–1.2)
Total Protein: 7.7 g/dL (ref 6.5–8.1)

## 2019-11-23 LAB — LACTIC ACID, PLASMA: Lactic Acid, Venous: 1.2 mmol/L (ref 0.5–1.9)

## 2019-11-23 LAB — LIPASE, BLOOD: Lipase: 2624 U/L — ABNORMAL HIGH (ref 11–51)

## 2019-11-23 MED ORDER — ONDANSETRON HCL 4 MG/2ML IJ SOLN
4.0000 mg | Freq: Once | INTRAMUSCULAR | Status: AC
  Administered 2019-11-23: 19:00:00 4 mg via INTRAVENOUS
  Filled 2019-11-23: qty 2

## 2019-11-23 MED ORDER — MOMETASONE FURO-FORMOTEROL FUM 200-5 MCG/ACT IN AERO
2.0000 | INHALATION_SPRAY | Freq: Two times a day (BID) | RESPIRATORY_TRACT | Status: DC
  Administered 2019-11-24 – 2019-11-25 (×3): 2 via RESPIRATORY_TRACT
  Filled 2019-11-23: qty 8.8

## 2019-11-23 MED ORDER — ADULT MULTIVITAMIN W/MINERALS CH
1.0000 | ORAL_TABLET | Freq: Every day | ORAL | Status: DC
  Administered 2019-11-24 – 2019-11-25 (×3): 1 via ORAL
  Filled 2019-11-23 (×3): qty 1

## 2019-11-23 MED ORDER — SODIUM CHLORIDE 0.9 % IV BOLUS
1000.0000 mL | Freq: Once | INTRAVENOUS | Status: AC
  Administered 2019-11-23: 1000 mL via INTRAVENOUS

## 2019-11-23 MED ORDER — HYDROCODONE-ACETAMINOPHEN 5-325 MG PO TABS
1.0000 | ORAL_TABLET | Freq: Four times a day (QID) | ORAL | Status: DC | PRN
  Administered 2019-11-23 – 2019-11-24 (×3): 1 via ORAL
  Filled 2019-11-23 (×3): qty 1

## 2019-11-23 MED ORDER — DULOXETINE HCL 30 MG PO CPEP
30.0000 mg | ORAL_CAPSULE | Freq: Every day | ORAL | Status: DC
  Administered 2019-11-24 – 2019-11-25 (×3): 30 mg via ORAL
  Filled 2019-11-23 (×3): qty 1

## 2019-11-23 MED ORDER — METHOCARBAMOL 500 MG PO TABS: 500.0000 mg | ORAL_TABLET | Freq: Three times a day (TID) | ORAL | Status: DC

## 2019-11-23 MED ORDER — POTASSIUM CHLORIDE 20 MEQ PO PACK
40.0000 meq | PACK | Freq: Once | ORAL | Status: AC
  Administered 2019-11-24: 40 meq via ORAL
  Filled 2019-11-23: qty 2

## 2019-11-23 MED ORDER — LORATADINE 10 MG PO TABS
10.0000 mg | ORAL_TABLET | Freq: Every day | ORAL | Status: DC
  Administered 2019-11-24 – 2019-11-25 (×2): 10 mg via ORAL
  Filled 2019-11-23 (×2): qty 1

## 2019-11-23 MED ORDER — ERGOCALCIFEROL 1.25 MG (50000 UT) PO CAPS: 50000.0000 [IU] | ORAL_CAPSULE | ORAL | Status: DC

## 2019-11-23 MED ORDER — IOHEXOL 9 MG/ML PO SOLN
500.0000 mL | Freq: Two times a day (BID) | ORAL | Status: DC | PRN
  Administered 2019-11-23 (×2): 500 mL via ORAL

## 2019-11-23 MED ORDER — MORPHINE SULFATE (PF) 4 MG/ML IV SOLN
4.0000 mg | Freq: Once | INTRAVENOUS | Status: AC
  Administered 2019-11-23: 19:00:00 4 mg via INTRAVENOUS
  Filled 2019-11-23: qty 1

## 2019-11-23 MED ORDER — MORPHINE SULFATE (PF) 2 MG/ML IV SOLN
2.0000 mg | INTRAVENOUS | Status: DC | PRN
  Administered 2019-11-24: 2 mg via INTRAVENOUS
  Filled 2019-11-23: qty 1

## 2019-11-23 MED ORDER — IOHEXOL 300 MG/ML  SOLN
100.0000 mL | Freq: Once | INTRAMUSCULAR | Status: AC | PRN
  Administered 2019-11-23: 20:00:00 100 mL via INTRAVENOUS

## 2019-11-23 MED ORDER — PANTOPRAZOLE SODIUM 40 MG PO TBEC
40.0000 mg | DELAYED_RELEASE_TABLET | Freq: Every day | ORAL | Status: DC
  Administered 2019-11-24 – 2019-11-25 (×3): 40 mg via ORAL
  Filled 2019-11-23 (×3): qty 1

## 2019-11-23 MED ORDER — PNEUMOCOCCAL VAC POLYVALENT 25 MCG/0.5ML IJ INJ
0.5000 mL | INJECTION | INTRAMUSCULAR | Status: AC
  Administered 2019-11-25: 0.5 mL via INTRAMUSCULAR
  Filled 2019-11-23: qty 0.5

## 2019-11-23 MED ORDER — CARBIDOPA-LEVODOPA 25-100 MG PO TABS
1.0000 | ORAL_TABLET | Freq: Every day | ORAL | Status: DC
  Administered 2019-11-24: 1 via ORAL
  Administered 2019-11-24: 2 via ORAL
  Filled 2019-11-23 (×3): qty 2

## 2019-11-23 NOTE — H&P (Signed)
[facility] [place] NAME: Diana Ray    MR#:  [id number]  DATE OF BIRTH:  1951-07-28  DATE OF ADMISSION:  11/23/2019  PRIMARY CARE PHYSICIAN: Kirk Ruths, MD   REQUESTING/REFERRING PHYSICIAN: Shirlyn Goltz, MD  CHIEF COMPLAINT:   Chief Complaint  Patient presents with  . Flank Pain    HISTORY OF PRESENT ILLNESS:  Diana Ray  is a 69 y.o. Caucasian female with a known history of asthma, GERD, IBS and osteoarthritis, who presented to the emergency room with acute onset of epigastric and left upper quadrant abdominal pain with associated fever at home that was up to 101 and chills.  Her pain has been radiating to her left lower quadrant.  She admits to nausea without vomiting.  She admits to constipation.  She drinks about 4 glasses of wine per week and had a shot of eggnog on Wednesday.  Her abdominal pain started on Friday.  She denied any chest pain or dyspnea or cough or wheezing.  Her husband has been exposed to his neighbor who tested positive for COVID-19 but he tested negative.  She admitted to body aches and mild headache with the symptoms with no loss of taste or smell.  Upon presentation to the emergency room, blood pressure was 150/88 with a pulse of 94 with otherwise normal vital signs.  Labs revealed mild hypokalemia of 3.2 and significant elevated lipase level of 2624 with alk phos of 270 and total bili of 5.1 indirect bili 1.8 and direct bili of 3.3 with AST 198 ALT of 391.  UA was unremarkable.  Abdominal pelvic CT scan revealed colonic diverticulosis without diverticulitis or other acute findings.  There was no evidence for biliary obstruction and it showed prior cholecystectomy.  Influenza A and B antigen were negative.  COVID-19 PCR was negative.  The patient was given 4 mg of IV morphine sulfate and 4 mg of IV Zofran as well as 2 L of IV normal saline bolus.  She will be admitted to a medical bed for further evaluation and  management. PAST MEDICAL HISTORY:   Past Medical History:  Diagnosis Date  . Allergy   . Arthritis    both knees, both hands and lumbar spine.  . Asthma   . GERD (gastroesophageal reflux disease)   . Headache, acute   . Hernia, hiatal   . IBS (irritable bowel syndrome)   . Myalgia   . Osteoarthritis of both knees   . Osteoporosis   . RLS (restless legs syndrome)   . Sleep apnea   . Zenker's diverticulum     PAST SURGICAL HISTORY:   Past Surgical History:  Procedure Laterality Date  . ABDOMINAL HYSTERECTOMY  2006  . CHOLECYSTECTOMY  2006  . COLONOSCOPY  2015  . COLONOSCOPY WITH PROPOFOL N/A 07/22/2019   Procedure: COLONOSCOPY WITH PROPOFOL;  Surgeon: Lollie Sails, MD;  Location: [facility] ENDOSCOPY;  Service: Endoscopy;  Laterality: N/A;  . ESOPHAGOGASTRODUODENOSCOPY    . ESOPHAGOGASTRODUODENOSCOPY (EGD) WITH PROPOFOL N/A 01/09/2018   Procedure: ESOPHAGOGASTRODUODENOSCOPY (EGD) WITH PROPOFOL;  Surgeon: Toledo, Benay Pike, MD;  Location: [facility] ENDOSCOPY;  Service: Gastroenterology;  Laterality: N/A;  . KYPHOPLASTY N/A 05/30/2018   Procedure: Hewitt Shorts;  Surgeon: Hessie Knows, MD;  Location: [facility] ORS;  Service: Orthopedics;  Laterality: N/A;  . KYPHOPLASTY    . LIPOMA EXCISION  05/2017   back of neck  . MENISCUS REPAIR Bilateral 2010,2011    SOCIAL HISTORY:   Social History  Tobacco Use  . Smoking status: Never Smoker  . Smokeless tobacco: Never Used  Substance Use Topics  . Alcohol use: Yes    Alcohol/week: 9.0 standard drinks    Types: 4 Standard drinks or equivalent, 5 Glasses of wine per week    FAMILY HISTORY:   Family History  Problem Relation Age of Onset  . Heart disease Mother   . Heart disease Father   . Diabetes Brother   . Breast cancer Neg Hx     DRUG ALLERGIES:  No Known Allergies  REVIEW OF SYSTEMS:   ROS As per history of present illness. All pertinent systems were reviewed above. Constitutional,  HEENT, cardiovascular,  respiratory, GI, GU, musculoskeletal, neuro, psychiatric, endocrine,  integumentary and hematologic systems were reviewed and are otherwise  negative/unremarkable except for positive findings mentioned above in the HPI.   MEDICATIONS AT HOME:   Prior to Admission medications   Medication Sig Start Date End Date Taking? Authorizing Provider  albuterol (PROVENTIL HFA;VENTOLIN HFA) 108 (90 Base) MCG/ACT inhaler Inhale 2 puffs into the lungs every 6 (six) hours as needed for wheezing or shortness of breath.    [provider]  Aspirin-Acetaminophen-Caffeine (EXCEDRIN PO) Take 1 tablet by mouth daily as needed.    [provider]  carbidopa-levodopa (SINEMET IR) 25-100 MG tablet TAKE 1 OR 2 TABLETS BY MOUTH nightly 05/07/17   [provider]  cetirizine (ZYRTEC) 10 MG tablet Take 10 mg by mouth daily as needed.     [provider]  DULoxetine (CYMBALTA) 30 MG capsule Take 30 mg by mouth daily. 05/07/17   [provider]  ergocalciferol (VITAMIN D2) 50000 units capsule Take 50,000 Units by mouth once a week.    [provider]  Fluticasone-Salmeterol (ADVAIR DISKUS) 250-50 MCG/DOSE AEPB INHALE ONE PUFF INTO THE LUNGS EVERY TWELVE HOURS 06/09/16   [provider]  HYDROcodone-acetaminophen (NORCO) 5-325 MG tablet Take 1 tablet by mouth every 6 (six) hours as needed for moderate pain. 05/30/18   Hessie Knows, MD  methocarbamol (ROBAXIN) 500 MG tablet Take 500 mg by mouth 3 (three) times daily.    [provider]  Multiple Vitamin (MULTIVITAMIN) tablet Take 1 tablet by mouth daily.    [provider]  omeprazole (PRILOSEC) 20 MG capsule Take 1 tablet by mouth once daily. Take 30 min before meals. 05/07/17   [provider]      VITAL SIGNS:  Blood pressure (!) 141/77, pulse 85, temperature 98.8 F (37.1 C), temperature source Oral, resp. rate 16, height 5' 2"  (1.575 m), weight 89.4 kg, SpO2 96 %.  PHYSICAL  EXAMINATION:  Physical Exam  GENERAL:  69 y.o.-year-old Caucasian female patient lying in the bed with no acute distress.  EYES: Pupils equal, round, reactive to light and accommodation. No scleral icterus. Extraocular muscles intact.  HEENT: Head atraumatic, normocephalic. Oropharynx and nasopharynx clear.  NECK:  Supple, no jugular venous distention. No thyroid enlargement, no tenderness.  LUNGS: Normal breath sounds bilaterally, no wheezing, rales,rhonchi or crepitation. No use of accessory muscles of respiration.  CARDIOVASCULAR: Regular rate and rhythm, S1, S2 normal. No murmurs, rubs, or gallops.  ABDOMEN: Soft, nondistended, nontender. Bowel sounds present. No organomegaly or mass.  EXTREMITIES: No pedal edema, cyanosis, or clubbing.  NEUROLOGIC: Cranial nerves II through XII are intact. Muscle strength 5/5 in all extremities. Sensation intact. Gait not checked.  PSYCHIATRIC: The patient is alert and oriented x 3.  Normal affect and good eye contact. SKIN: No obvious rash,  lesion, or ulcer.   LABORATORY PANEL:   CBC Recent Labs  Lab 11/23/19 1718  WBC 7.9  HGB 13.9  HCT 41.4  PLT 241   ------------------------------------------------------------------------------------------------------------------  Chemistries  Recent Labs  Lab 11/23/19 1718  NA 135  K 3.2*  CL 99  CO2 26  GLUCOSE 110*  BUN 8  CREATININE 0.65  CALCIUM 8.7*  AST 198*  ALT 391*  ALKPHOS 270*  BILITOT 5.1*   ------------------------------------------------------------------------------------------------------------------  Cardiac Enzymes No results for input(s): TROPONINI in the last 168 hours. ------------------------------------------------------------------------------------------------------------------  RADIOLOGY:  CT ABDOMEN PELVIS W CONTRAST  Result Date: 11/23/2019 CLINICAL DATA:  Left-sided abdominal pain and abdominal distention 2 days. EXAM: CT ABDOMEN AND PELVIS WITH CONTRAST  TECHNIQUE: Multidetector CT imaging of the abdomen and pelvis was performed using the standard protocol following bolus administration of intravenous contrast. CONTRAST:  184m OMNIPAQUE IOHEXOL 300 MG/ML  SOLN COMPARISON:  None. FINDINGS: Lower Chest: No acute findings. Hepatobiliary: No hepatic masses identified. Prior cholecystectomy. No evidence of biliary obstruction. Pancreas:  No mass or inflammatory changes. Spleen: Within normal limits in size and appearance. Adrenals/Urinary Tract: No masses identified. No evidence of hydronephrosis. Unremarkable unopacified urinary bladder. Stomach/Bowel: No evidence of obstruction, inflammatory process or abnormal fluid collections. Normal appendix visualized. Diverticulosis is seen involving the ascending and transverse colon, however there is no evidence of diverticulitis. Vascular/Lymphatic: No pathologically enlarged lymph nodes. No abdominal aortic aneurysm. Reproductive: Prior hysterectomy noted. Adnexal regions are unremarkable in appearance. Other:  None. Musculoskeletal: No suspicious bone lesions identified. Old L2 vertebral compression fracture and vertebroplasty noted. IMPRESSION: Colonic diverticulosis. No radiographic evidence of diverticulitis or other acute findings. Electronically Signed   By: JMarlaine HindM.D.   On: 11/23/2019 20:05   DG Chest Port 1 View  Result Date: 11/23/2019 CLINICAL DATA:  Fever. EXAM: PORTABLE CHEST 1 VIEW COMPARISON:  None. FINDINGS: The heart size and mediastinal contours are within normal limits. Both lungs are clear. The visualized skeletal structures are unremarkable. IMPRESSION: No active disease. Electronically Signed   By: DDorise BullionIII M.D   On: 11/23/2019 18:47      IMPRESSION AND PLAN:  1.  Acute pancreatitis.  This could be alcoholic.  The patient will be admitted to a medical monitored bed.  We will follow serial lipase levels.  Pain management will be provided.  We will check triglycerides.  Given  elevated LFTs will obtain right upper quadrant ultrasound to assess for any missed biliary stones especially given elevated alk phos and total and direct bili.  Will obtain a GI consultation as well.  I notified Dr. WAllen Norrisabout the patient.  2.  Elevated LFTs with jaundice.  Management as above.  3.  Hypokalemia.  Potassium will be replaced and magnesium level will be checked.  4.  Asthma.  No current exacerbation continue Advair Diskus.  5.  Restless leg syndrome.  We will continue her Sinemet.  6.  Fibromyalgia.  We will hold her Cymbalta as it can cause elevated LFTs.  7.  DVT prophylaxis.  Subcutaneous Lovenox.    All the records are reviewed and case discussed with ED provider. The plan of care was discussed in details with the patient (and family). I answered all questions. The patient agreed to proceed with the above mentioned plan. Further management will depend upon hospital course.   CODE STATUS: Full code  TOTAL TIME TAKING CARE OF THIS PATIENT: 55 minutes.    JChristel MormonM.D on 11/23/2019 at 9:29 PM  [facility]   From 7 PM-7 AM, contact night-coverage [email or web address]  CC: Primary care physician; Kirk Ruths, MD   Note: This dictation was prepared with Dragon dictation along with smaller phrase technology. Any transcriptional errors that result from this process are unintentional.

## 2019-11-23 NOTE — ED Triage Notes (Addendum)
Pt was tested for covid at [facility] today and was negative - [facility] said for pt to go to ED for dehydration - pt c/o left side pain x2 days - denies N/V/D

## 2019-11-23 NOTE — ED Provider Notes (Signed)
[facility] EMERGENCY DEPARTMENT Provider Note   CSN: [id number] Arrival date & time: 11/23/19  1709     History Chief Complaint  Patient presents with  . Flank Pain    Diana Ray is a 69 y.o. female hx of GERD, restless leg syndrome, here presenting with abdominal pain, fever.  Patient states that about 2 weeks ago, one of her close friends had Covid.  Her husband tested negative 5 days ago.  For the last 3 to 4 days, she has been having epigastric pain and left upper quadrant pain.  She states that it is associated with some nausea but no vomiting .  Patient also has been running fever since yesterday with a T-max of 101 .  Patient has no cough.  Patient went to urgent care and had a negative Covid antigen test.  She also has some ketones in her urine but no obvious UTI was sent here for further evaluation.  The history is provided by the patient.       Past Medical History:  Diagnosis Date  . Allergy   . Arthritis    both knees, both hands and lumbar spine.  . Asthma   . GERD (gastroesophageal reflux disease)   . Headache, acute   . Hernia, hiatal   . IBS (irritable bowel syndrome)   . Myalgia   . Osteoarthritis of both knees   . Osteoporosis   . RLS (restless legs syndrome)   . Sleep apnea   . Zenker's diverticulum     Patient Active Problem List   Diagnosis Date Noted  . Lipoma of neck 05/21/2017    Past Surgical History:  Procedure Laterality Date  . ABDOMINAL HYSTERECTOMY  2006  . CHOLECYSTECTOMY  2006  . COLONOSCOPY  2015  . COLONOSCOPY WITH PROPOFOL N/A 07/22/2019   Procedure: COLONOSCOPY WITH PROPOFOL;  Surgeon: Lollie Sails, MD;  Location: [facility] ENDOSCOPY;  Service: Endoscopy;  Laterality: N/A;  . ESOPHAGOGASTRODUODENOSCOPY    . ESOPHAGOGASTRODUODENOSCOPY (EGD) WITH PROPOFOL N/A 01/09/2018   Procedure: ESOPHAGOGASTRODUODENOSCOPY (EGD) WITH PROPOFOL;  Surgeon: Toledo, Benay Pike, MD;  Location: [facility] ENDOSCOPY;  Service:  Gastroenterology;  Laterality: N/A;  . KYPHOPLASTY N/A 05/30/2018   Procedure: Hewitt Shorts;  Surgeon: Hessie Knows, MD;  Location: [facility] ORS;  Service: Orthopedics;  Laterality: N/A;  . KYPHOPLASTY    . LIPOMA EXCISION  05/2017   back of neck  . MENISCUS REPAIR Bilateral 2010,2011     OB History    Gravida  2   Para      Term      Preterm      AB      Living        SAB      TAB      Ectopic      Multiple      Live Births  2        Obstetric Comments  1st Menstrual Cycle:  16 1st Pregnancy: 76         Family History  Problem Relation Age of Onset  . Heart disease Mother   . Heart disease Father   . Diabetes Brother   . Breast cancer Neg Hx     Social History   Tobacco Use  . Smoking status: Never Smoker  . Smokeless tobacco: Never Used  Substance Use Topics  . Alcohol use: Yes    Alcohol/week: 9.0 standard drinks    Types: 4 Standard drinks or equivalent, 5 Glasses of wine  per week  . Drug use: No    Home Medications Prior to Admission medications   Medication Sig Start Date End Date Taking? Authorizing Provider  albuterol (PROVENTIL HFA;VENTOLIN HFA) 108 (90 Base) MCG/ACT inhaler Inhale 2 puffs into the lungs every 6 (six) hours as needed for wheezing or shortness of breath.    [provider]  Aspirin-Acetaminophen-Caffeine (EXCEDRIN PO) Take 1 tablet by mouth daily as needed.    [provider]  carbidopa-levodopa (SINEMET IR) 25-100 MG tablet TAKE 1 OR 2 TABLETS BY MOUTH nightly 05/07/17   [provider]  cetirizine (ZYRTEC) 10 MG tablet Take 10 mg by mouth daily as needed.     [provider]  DULoxetine (CYMBALTA) 30 MG capsule Take 30 mg by mouth daily. 05/07/17   [provider]  ergocalciferol (VITAMIN D2) 50000 units capsule Take 50,000 Units by mouth once a week.    [provider]  Fluticasone-Salmeterol (ADVAIR DISKUS) 250-50 MCG/DOSE AEPB INHALE ONE PUFF INTO THE LUNGS EVERY  TWELVE HOURS 06/09/16   [provider]  HYDROcodone-acetaminophen (NORCO) 5-325 MG tablet Take 1 tablet by mouth every 6 (six) hours as needed for moderate pain. 05/30/18   Hessie Knows, MD  methocarbamol (ROBAXIN) 500 MG tablet Take 500 mg by mouth 3 (three) times daily.    [provider]  Multiple Vitamin (MULTIVITAMIN) tablet Take 1 tablet by mouth daily.    [provider]  omeprazole (PRILOSEC) 20 MG capsule Take 1 tablet by mouth once daily. Take 30 min before meals. 05/07/17   [provider]    Allergies    Patient has no known allergies.  Review of Systems   Review of Systems  Constitutional: Positive for fever.  Gastrointestinal: Positive for abdominal pain.  All other systems reviewed and are negative.   Physical Exam Updated Vital Signs BP (!) 141/77 (BP Location: Left Arm)   Pulse 85   Temp 98.8 F (37.1 C) (Oral)   Resp 16   Ht 5\' 2"  (1.575 m)   Wt 89.4 kg   SpO2 96%   BMI 36.03 kg/m   Physical Exam Vitals and nursing note reviewed.  HENT:     Head: Normocephalic.     Nose: Nose normal.     Mouth/Throat:     Mouth: Mucous membranes are dry.  Eyes:     Extraocular Movements: Extraocular movements intact.     Pupils: Pupils are equal, round, and reactive to light.  Cardiovascular:     Rate and Rhythm: Normal rate and regular rhythm.     Pulses: Normal pulses.     Heart sounds: Normal heart sounds.  Pulmonary:     Effort: Pulmonary effort is normal.     Breath sounds: Normal breath sounds.  Abdominal:     General: Abdomen is flat.     Palpations: Abdomen is soft.     Comments: + LUQ and epigastric tenderness   Musculoskeletal:        General: Normal range of motion.     Cervical back: Normal range of motion.  Skin:    General: Skin is warm.     Capillary Refill: Capillary refill takes less than 2 seconds.  Neurological:     General: No focal deficit present.     Mental Status: She is alert and oriented to person,  place, and time.  Psychiatric:        Mood and Affect: Mood normal.        Behavior: Behavior  normal.     ED Results / Procedures / Treatments   Labs (all labs ordered are listed, but only abnormal results are displayed) Labs Reviewed  URINALYSIS, COMPLETE (UACMP) WITH MICROSCOPIC - Abnormal; Notable for the following components:      Result Value   Color, Urine AMBER (*)    APPearance CLEAR (*)    Bilirubin Urine SMALL (*)    Ketones, ur 5 (*)    Protein, ur 30 (*)    Bacteria, UA RARE (*)    All other components within normal limits  BASIC METABOLIC PANEL - Abnormal; Notable for the following components:   Potassium 3.2 (*)    Glucose, Bld 110 (*)    Calcium 8.7 (*)    All other components within normal limits  LIPASE, BLOOD - Abnormal; Notable for the following components:   Lipase 2,624 (*)    All other components within normal limits  HEPATIC FUNCTION PANEL - Abnormal; Notable for the following components:   AST 198 (*)    ALT 391 (*)    Alkaline Phosphatase 270 (*)    Total Bilirubin 5.1 (*)    Bilirubin, Direct 3.3 (*)    Indirect Bilirubin 1.8 (*)    All other components within normal limits  RESPIRATORY PANEL BY RT PCR (FLU A&B, COVID)  CULTURE, BLOOD (ROUTINE X 2)  CULTURE, BLOOD (ROUTINE X 2)  URINE CULTURE  CBC  LACTIC ACID, PLASMA    EKG None  Radiology CT ABDOMEN PELVIS W CONTRAST  Result Date: 11/23/2019 CLINICAL DATA:  Left-sided abdominal pain and abdominal distention 2 days. EXAM: CT ABDOMEN AND PELVIS WITH CONTRAST TECHNIQUE: Multidetector CT imaging of the abdomen and pelvis was performed using the standard protocol following bolus administration of intravenous contrast. CONTRAST:  145mL OMNIPAQUE IOHEXOL 300 MG/ML  SOLN COMPARISON:  None. FINDINGS: Lower Chest: No acute findings. Hepatobiliary: No hepatic masses identified. Prior cholecystectomy. No evidence of biliary obstruction. Pancreas:  No mass or inflammatory changes. Spleen: Within normal  limits in size and appearance. Adrenals/Urinary Tract: No masses identified. No evidence of hydronephrosis. Unremarkable unopacified urinary bladder. Stomach/Bowel: No evidence of obstruction, inflammatory process or abnormal fluid collections. Normal appendix visualized. Diverticulosis is seen involving the ascending and transverse colon, however there is no evidence of diverticulitis. Vascular/Lymphatic: No pathologically enlarged lymph nodes. No abdominal aortic aneurysm. Reproductive: Prior hysterectomy noted. Adnexal regions are unremarkable in appearance. Other:  None. Musculoskeletal: No suspicious bone lesions identified. Old L2 vertebral compression fracture and vertebroplasty noted. IMPRESSION: Colonic diverticulosis. No radiographic evidence of diverticulitis or other acute findings. Electronically Signed   By: Marlaine Hind M.D.   On: 11/23/2019 20:05   DG Chest Port 1 View  Result Date: 11/23/2019 CLINICAL DATA:  Fever. EXAM: PORTABLE CHEST 1 VIEW COMPARISON:  None. FINDINGS: The heart size and mediastinal contours are within normal limits. Both lungs are clear. The visualized skeletal structures are unremarkable. IMPRESSION: No active disease. Electronically Signed   By: Dorise Bullion III M.D   On: 11/23/2019 18:47    Procedures Procedures (including critical care time)  CRITICAL CARE Performed by: Wandra Arthurs   Total critical care time: 30 minutes  Critical care time was exclusive of separately billable procedures and treating other patients.  Critical care was necessary to treat or prevent imminent or life-threatening deterioration.  Critical care was time spent personally by me on the following activities: development of treatment plan with patient and/or surrogate as well as nursing, discussions with consultants, evaluation of patient's  response to treatment, examination of patient, obtaining history from patient or surrogate, ordering and performing treatments and  interventions, ordering and review of laboratory studies, ordering and review of radiographic studies, pulse oximetry and re-evaluation of patient's condition.    Medications Ordered in ED Medications  iohexol (OMNIPAQUE) 9 MG/ML oral solution 500 mL (500 mLs Oral Contrast Given 11/23/19 1839)  sodium chloride 0.9 % bolus 1,000 mL (0 mLs Intravenous Stopped 11/23/19 2042)  morphine 4 MG/ML injection 4 mg (4 mg Intravenous Given 11/23/19 1857)  ondansetron (ZOFRAN) injection 4 mg (4 mg Intravenous Given 11/23/19 1857)  iohexol (OMNIPAQUE) 300 MG/ML solution 100 mL (100 mLs Intravenous Contrast Given 11/23/19 1951)  sodium chloride 0.9 % bolus 1,000 mL (1,000 mLs Intravenous New Bag/Given 11/23/19 2045)    ED Course  I have reviewed the triage vital signs and the nursing notes.  Pertinent labs & imaging results that were available during my care of the patient were reviewed by me and considered in my medical decision making (see chart for details).    MDM Rules/Calculators/A&P                      Diana Ray is a 69 y.o. female here with abdominal pain, fever. Consider cholecystitis versus pancreatitis versus pyelonephritis versus pneumonia versus Covid. [facility] Will get COVID PCR. Will get CBC, CMP, lipase, lactate, cultures, CXR, CT ab/pel.   8:47 PM Labs show elevated bilirubin and LFTs.  Her lipase is also 2000.  Initially ordered a right upper quadrant ultrasound but patient had a cholecystectomy.  CT abdomen pelvis performed and showed pancreatitis but no pseudocyst or other complications .  Given 2 L normal saline bolus and IV pain meds and Zofran.  Will admit for acute pancreatitis.  Final Clinical Impression(s) / ED Diagnoses Final diagnoses:  RUQ pain    Rx / DC Orders ED Discharge Orders    None       Drenda Freeze, MD 11/23/19 2048

## 2019-11-24 ENCOUNTER — Inpatient Hospital Stay: Payer: Medicare PPO

## 2019-11-24 DIAGNOSIS — K8500 Idiopathic acute pancreatitis without necrosis or infection: Secondary | ICD-10-CM

## 2019-11-24 DIAGNOSIS — G2581 Restless legs syndrome: Secondary | ICD-10-CM

## 2019-11-24 DIAGNOSIS — R17 Unspecified jaundice: Secondary | ICD-10-CM

## 2019-11-24 DIAGNOSIS — R7989 Other specified abnormal findings of blood chemistry: Secondary | ICD-10-CM

## 2019-11-24 LAB — MAGNESIUM: Magnesium: 1.8 mg/dL (ref 1.7–2.4)

## 2019-11-24 LAB — LIPID PANEL
Cholesterol: 213 mg/dL — ABNORMAL HIGH (ref 0–200)
HDL: 37 mg/dL — ABNORMAL LOW (ref >40)
LDL Cholesterol: 150 mg/dL — ABNORMAL HIGH (ref 0–99)
Total CHOL/HDL Ratio: 5.8 RATIO
Triglycerides: 132 mg/dL (ref <150)
VLDL: 26 mg/dL (ref 0–40)

## 2019-11-24 LAB — HEPATIC FUNCTION PANEL
ALT: 339 U/L — ABNORMAL HIGH (ref 0–44)
AST: 165 U/L — ABNORMAL HIGH (ref 15–41)
Albumin: 3 g/dL — ABNORMAL LOW (ref 3.5–5.0)
Alkaline Phosphatase: 219 U/L — ABNORMAL HIGH (ref 38–126)
Bilirubin, Direct: 2.8 mg/dL — ABNORMAL HIGH (ref 0.0–0.2)
Indirect Bilirubin: 1.5 mg/dL — ABNORMAL HIGH (ref 0.3–0.9)
Total Bilirubin: 4.3 mg/dL — ABNORMAL HIGH (ref 0.3–1.2)
Total Protein: 6.4 g/dL — ABNORMAL LOW (ref 6.5–8.1)

## 2019-11-24 LAB — URINE CULTURE

## 2019-11-24 LAB — LIPASE, BLOOD: Lipase: 1004 U/L — ABNORMAL HIGH (ref 11–51)

## 2019-11-24 MED ORDER — POLYETHYLENE GLYCOL 3350 17 G PO PACK: 17.0000 g | PACK | Freq: Two times a day (BID) | ORAL | Status: DC

## 2019-11-24 MED ORDER — ENOXAPARIN SODIUM 40 MG/0.4ML ~~LOC~~ SOLN
40.0000 mg | SUBCUTANEOUS | Status: DC
  Administered 2019-11-24: 40 mg via SUBCUTANEOUS
  Filled 2019-11-24: qty 0.4

## 2019-11-24 MED ORDER — ONDANSETRON HCL 4 MG/2ML IJ SOLN
4.0000 mg | Freq: Four times a day (QID) | INTRAMUSCULAR | Status: DC | PRN
  Administered 2019-11-24: 4 mg via INTRAVENOUS

## 2019-11-24 MED ORDER — ONDANSETRON HCL 4 MG/2ML IJ SOLN
INTRAMUSCULAR | Status: AC
  Filled 2019-11-24: qty 2

## 2019-11-24 MED ORDER — POLYETHYLENE GLYCOL 3350 17 G PO PACK: 17.0000 g | PACK | Freq: Every day | ORAL | Status: DC | PRN

## 2019-11-24 MED ORDER — SODIUM CHLORIDE 0.9 % IV SOLN: INTRAVENOUS | Status: DC

## 2019-11-24 MED ORDER — POLYETHYLENE GLYCOL 3350 17 G PO PACK: 17.0000 g | PACK | Freq: Every day | ORAL | Status: DC

## 2019-11-24 NOTE — Progress Notes (Signed)
Initial Nutrition Assessment  DOCUMENTATION CODES:   Obesity unspecified  INTERVENTION:   RD will monitor for diet advancement and order supplements if needed  NUTRITION DIAGNOSIS:   Inadequate oral intake related to acute illness as evidenced by per patient/family report.  GOAL:   Patient will meet greater than or equal to 90% of their needs  MONITOR:   Diet advancement, Labs, Weight trends, Skin, I & O's  REASON FOR ASSESSMENT:   Malnutrition Screening Tool    ASSESSMENT:   69 y.o. Caucasian female with a known history of asthma, GERD, IBS and osteoarthritis, who presented to the emergency room with acute onset of epigastric and left upper quadrant abdominal pain with associated fever at home that was up to 101 and chills.   Pt reports poor appetite and oral intake for several days pta r/t nausea and abdominal pain. Pt with good appetite and oral intake at baseline. Pt currently NPO for work-up today. RD will monitor for diet advancement and order supplements if needed. Per chart, pt appears weight stable pta.   Medications reviewed and include: MVI, protonix  Labs reviewed: K 3.2(L), alk phos 270(H), lipase 2624(H), AST 198(H), ALT 391(H), tbili 5.1(H)  NUTRITION - FOCUSED PHYSICAL EXAM:    Most Recent Value  Orbital Region  No depletion  Upper Arm Region  No depletion  Thoracic and Lumbar Region  No depletion  Buccal Region  No depletion  Temple Region  No depletion  Clavicle Bone Region  No depletion  Clavicle and Acromion Bone Region  No depletion  Scapular Bone Region  No depletion  Dorsal Hand  No depletion  Patellar Region  No depletion  Anterior Thigh Region  No depletion  Posterior Calf Region  No depletion  Edema (RD Assessment)  None  Hair  Reviewed  Eyes  Reviewed  Mouth  Reviewed  Skin  Reviewed  Nails  Reviewed     Diet Order:   Diet Order    None     EDUCATION NEEDS:   No education needs have been identified at this time  Skin:   Skin Assessment: Reviewed RN Assessment  Last BM:  1/31  Height:   Ht Readings from Last 1 Encounters:  11/23/19 5' 2"  (1.575 m)    Weight:   Wt Readings from Last 1 Encounters:  11/23/19 92.6 kg    Ideal Body Weight:  50 kg  BMI:  Body mass index is 37.34 kg/m.  Estimated Nutritional Needs:   Kcal:  1800-2100kcal/day  Protein:  90-105g/day  Fluid:  1.5L/day  Koleen Distance MS, RD, LDN Pager #- [phone] Office#- [phone] After Hours Pager: [phone]

## 2019-11-24 NOTE — Consult Note (Signed)
Diana Darby, MD [address]  [place]  [place], [place] [address]  Main: [phone]  Fax: [phone] Pager: [phone]   Consultation  Referring Provider:     No ref. provider found Primary Care Physician:  Kirk Ruths, MD Primary Gastroenterologist:  Dr. Gustavo Lah         Reason for Consultation:   Acute pancreatitis, elevated LFTs  Date of Admission:  11/23/2019 Date of Consultation:  11/24/2019         HPI:   Diana Ray is a 69 y.o. female with history of restless leg syndrome who presented with 3 to 4 days of epigastric and left upper quadrant pain associated with nausea as well as fever, temperature of 101.  She was also tested negative for Covid recently.  On arrival, labs revealed mild hypokalemia 3.2, normal creatinine, normal CBC, elevated lipase of 2624, LFTs are elevated, AST 198, ALT 391, alkaline phosphatase 290, total bilirubin 5.1, direct bilirubin 3.3, normal lactic acid.  History of prior cholecystectomy, patient underwent CT scan with no evidence of biliary obstruction, normal-appearing pancreas.  GI is consulted due to elevated LFTs She also underwent right upper quadrant ultrasound which revealed mild fatty liver, no biliary dilatation Patient received 2 L of normal saline since admission. Triglycerides normal Admits to drinking a glass of wine or a cocktail about 3 times a week She does drink 2 cans of Mountain Dew daily  NSAIDs: Excedrin  Antiplts/Anticoagulants/Anti thrombotics: None  GI Procedures: EGD and colonoscopy by Dr. Gustavo Lah Several subcentimeter tubular adenomas in the colon, mildly obstructing Schatzki's ring that was dilated to 42 [place]  Past Medical History:  Diagnosis Date  . Allergy   . Arthritis    both knees, both hands and lumbar spine.  . Asthma   . GERD (gastroesophageal reflux disease)   . Headache, acute   . Hernia, hiatal   . IBS (irritable bowel syndrome)   . Myalgia   . Osteoarthritis  of both knees   . Osteoporosis   . RLS (restless legs syndrome)   . Sleep apnea   . Zenker's diverticulum     Past Surgical History:  Procedure Laterality Date  . ABDOMINAL HYSTERECTOMY  2006  . CHOLECYSTECTOMY  2006  . COLONOSCOPY  2015  . COLONOSCOPY WITH PROPOFOL N/A 07/22/2019   Procedure: COLONOSCOPY WITH PROPOFOL;  Surgeon: Lollie Sails, MD;  Location: [facility] ENDOSCOPY;  Service: Endoscopy;  Laterality: N/A;  . ESOPHAGOGASTRODUODENOSCOPY    . ESOPHAGOGASTRODUODENOSCOPY (EGD) WITH PROPOFOL N/A 01/09/2018   Procedure: ESOPHAGOGASTRODUODENOSCOPY (EGD) WITH PROPOFOL;  Surgeon: Toledo, Benay Pike, MD;  Location: [facility] ENDOSCOPY;  Service: Gastroenterology;  Laterality: N/A;  . KYPHOPLASTY N/A 05/30/2018   Procedure: Hewitt Shorts;  Surgeon: Hessie Knows, MD;  Location: [facility] ORS;  Service: Orthopedics;  Laterality: N/A;  . KYPHOPLASTY    . LIPOMA EXCISION  05/2017   back of neck  . MENISCUS REPAIR Bilateral 2010,2011    Prior to Admission medications   Medication Sig Start Date End Date Taking? Authorizing Provider  acetaminophen (TYLENOL) 500 MG tablet Take 500 mg by mouth every 6 (six) hours as needed.   Yes [provider]  albuterol (PROVENTIL HFA;VENTOLIN HFA) 108 (90 Base) MCG/ACT inhaler Inhale 2 puffs into the lungs every 6 (six) hours as needed for wheezing or shortness of breath.   Yes [provider]  Aspirin-Acetaminophen-Caffeine (EXCEDRIN PO) Take 1 tablet by mouth daily as needed.   Yes [provider]  carbidopa-levodopa (SINEMET  IR) 25-100 MG tablet TAKE 1 OR 2 TABLETS BY MOUTH nightly 05/07/17  Yes [provider]  cetirizine (ZYRTEC) 10 MG tablet Take 10 mg by mouth daily as needed.    Yes [provider]  DULoxetine (CYMBALTA) 30 MG capsule Take 30 mg by mouth daily. 05/07/17  Yes [provider]  Fluticasone-Salmeterol (ADVAIR DISKUS) 250-50 MCG/DOSE AEPB INHALE ONE PUFF INTO THE LUNGS EVERY TWELVE HOURS 06/09/16   Yes [provider]  Multiple Vitamin (MULTIVITAMIN) tablet Take 1 tablet by mouth daily.   Yes [provider]  omeprazole (PRILOSEC) 20 MG capsule Take 1 tablet by mouth once daily. Take 30 min before meals. 05/07/17  Yes [provider]  traMADol (ULTRAM) 50 MG tablet Take 1 tablet by mouth every 8 (eight) hours as needed. 11/11/18  Yes [provider]  ergocalciferol (VITAMIN D2) 50000 units capsule Take 50,000 Units by mouth once a week.    [provider]  HYDROcodone-acetaminophen (NORCO) 5-325 MG tablet Take 1 tablet by mouth every 6 (six) hours as needed for moderate pain. Patient not taking: Reported on 11/23/2019 05/30/18   Hessie Knows, MD  methocarbamol (ROBAXIN) 500 MG tablet Take 500 mg by mouth 3 (three) times daily.    [provider]    Current Facility-Administered Medications:  .  carbidopa-levodopa (SINEMET IR) 25-100 MG per tablet immediate release 1-2 tablet, 1-2 tablet, Oral, QHS, Mansy, Jan A, MD, 1 tablet at 11/24/19 0055 .  DULoxetine (CYMBALTA) DR capsule 30 mg, 30 mg, Oral, Daily, Mansy, Jan A, MD, 30 mg at 11/24/19 0900 .  HYDROcodone-acetaminophen (NORCO/VICODIN) 5-325 MG per tablet 1 tablet, 1 tablet, Oral, Q6H PRN, Mansy, Arvella Merles, MD, 1 tablet at 11/23/19 2303 .  iohexol (OMNIPAQUE) 9 MG/ML oral solution 500 mL, 500 mL, Oral, BID PRN, Drenda Freeze, MD, 500 mL at 11/23/19 1839 .  loratadine (CLARITIN) tablet 10 mg, 10 mg, Oral, Daily, Mansy, Jan A, MD, 10 mg at 11/24/19 0900 .  mometasone-formoterol (DULERA) 200-5 MCG/ACT inhaler 2 puff, 2 puff, Inhalation, BID, Mansy, Jan A, MD, 2 puff at 11/24/19 0900 .  morphine 2 MG/ML injection 2 mg, 2 mg, Intravenous, Q4H PRN, Mansy, Jan A, MD, 2 mg at 11/24/19 0058 .  multivitamin with minerals tablet 1 tablet, 1 tablet, Oral, Daily, Mansy, Jan A, MD, 1 tablet at 11/24/19 0900 .  pantoprazole (PROTONIX) EC tablet 40 mg, 40 mg, Oral, Daily, Mansy, Jan A, MD, 40 mg at  11/24/19 0900 .  pneumococcal 23 valent vaccine (PNEUMOVAX-23) injection 0.5 mL, 0.5 mL, Intramuscular, Tomorrow-1000, Mansy, Jan A, MD .  polyethylene glycol (MIRALAX / GLYCOLAX) packet 17 g, 17 g, Oral, Daily PRN, Ignacio Lowder, Tally Due, MD  Family History  Problem Relation Age of Onset  . Heart disease Mother   . Heart disease Father   . Diabetes Brother   . Breast cancer Neg Hx      Social History   Tobacco Use  . Smoking status: Never Smoker  . Smokeless tobacco: Never Used  Substance Use Topics  . Alcohol use: Yes    Alcohol/week: 9.0 standard drinks    Types: 4 Standard drinks or equivalent, 5 Glasses of wine per week  . Drug use: No    Allergies as of 11/23/2019  . (No Known Allergies)    Review of Systems:    All systems reviewed and negative except where noted in HPI.   Physical Exam:  Vital signs in last 24 hours: Temp:  [97.7  F (36.5 C)-98.8 F (37.1 C)] 98.2 F (36.8 C) (02/01 1207) Pulse Rate:  [79-94] 79 (02/01 1207) Resp:  [15-20] 18 (02/01 1207) BP: (111-150)/(68-96) 111/73 (02/01 1207) SpO2:  [94 %-98 %] 96 % (02/01 1207) Weight:  [89.4 kg-92.6 kg] 92.6 kg (01/31 2237) Last BM Date: 11/23/19 General:   Pleasant, cooperative in NAD Head:  Normocephalic and atraumatic. Eyes:   Positive icterus.   Conjunctiva pink. PERRLA. Ears:  Normal auditory acuity. Neck:  Supple; no masses or thyroidomegaly Lungs: Respirations even and unlabored. Lungs clear to auscultation bilaterally.   No wheezes, crackles, or rhonchi.  Heart:  Regular rate and rhythm;  Without murmur, clicks, rubs or gallops Abdomen:  Soft, obese, nondistended, nontender. Normal bowel sounds. No appreciable masses or hepatomegaly.  No rebound or guarding.  Rectal:  Not performed. Msk:  Symmetrical without gross deformities.  Strength normal Extremities:  Without edema, cyanosis or clubbing. Neurologic:  Alert and oriented x3;  grossly normal neurologically. Skin:  Intact without significant  lesions or rashes. Psych:  Alert and cooperative. Normal affect.  LAB RESULTS: CBC Latest Ref Rng & Units 11/23/2019  WBC 4.0 - 10.5 K/uL 7.9  Hemoglobin 12.0 - 15.0 g/dL 13.9  Hematocrit 36.0 - 46.0 % 41.4  Platelets 150 - 400 K/uL 241    BMET BMP Latest Ref Rng & Units 11/23/2019 08/21/2019  Glucose 70 - 99 mg/dL 110(H) -  BUN 8 - 23 mg/dL 8 -  Creatinine 0.44 - 1.00 mg/dL 0.65 0.70  Sodium 135 - 145 mmol/L 135 -  Potassium 3.5 - 5.1 mmol/L 3.2(L) -  Chloride 98 - 111 mmol/L 99 -  CO2 22 - 32 mmol/L 26 -  Calcium 8.9 - 10.3 mg/dL 8.7(L) -    LFT Hepatic Function Latest Ref Rng & Units 11/24/2019 11/23/2019  Total Protein 6.5 - 8.1 g/dL 6.4(L) 7.7  Albumin 3.5 - 5.0 g/dL 3.0(L) 3.6  AST 15 - 41 U/L 165(H) 198(H)  ALT 0 - 44 U/L 339(H) 391(H)  Alk Phosphatase 38 - 126 U/L 219(H) 270(H)  Total Bilirubin 0.3 - 1.2 mg/dL 4.3(H) 5.1(H)  Bilirubin, Direct 0.0 - 0.2 mg/dL 2.8(H) 3.3(H)     STUDIES: CT ABDOMEN PELVIS W CONTRAST  Result Date: 11/23/2019 CLINICAL DATA:  Left-sided abdominal pain and abdominal distention 2 days. EXAM: CT ABDOMEN AND PELVIS WITH CONTRAST TECHNIQUE: Multidetector CT imaging of the abdomen and pelvis was performed using the standard protocol following bolus administration of intravenous contrast. CONTRAST:  159m OMNIPAQUE IOHEXOL 300 MG/ML  SOLN COMPARISON:  None. FINDINGS: Lower Chest: No acute findings. Hepatobiliary: No hepatic masses identified. Prior cholecystectomy. No evidence of biliary obstruction. Pancreas:  No mass or inflammatory changes. Spleen: Within normal limits in size and appearance. Adrenals/Urinary Tract: No masses identified. No evidence of hydronephrosis. Unremarkable unopacified urinary bladder. Stomach/Bowel: No evidence of obstruction, inflammatory process or abnormal fluid collections. Normal appendix visualized. Diverticulosis is seen involving the ascending and transverse colon, however there is no evidence of diverticulitis.  Vascular/Lymphatic: No pathologically enlarged lymph nodes. No abdominal aortic aneurysm. Reproductive: Prior hysterectomy noted. Adnexal regions are unremarkable in appearance. Other:  None. Musculoskeletal: No suspicious bone lesions identified. Old L2 vertebral compression fracture and vertebroplasty noted. IMPRESSION: Colonic diverticulosis. No radiographic evidence of diverticulitis or other acute findings. Electronically Signed   By: JMarlaine HindM.D.   On: 11/23/2019 20:05   DG Chest Port 1 View  Result Date: 11/23/2019 CLINICAL DATA:  Fever. EXAM: PORTABLE CHEST 1 VIEW COMPARISON:  None. FINDINGS: The  heart size and mediastinal contours are within normal limits. Both lungs are clear. The visualized skeletal structures are unremarkable. IMPRESSION: No active disease. Electronically Signed   By: Dorise Bullion III M.D   On: 11/23/2019 18:47   US Abdomen Limited RUQ  Result Date: 11/24/2019 CLINICAL DATA:  Right upper quadrant abdominal pain for 3 days. EXAM: ULTRASOUND ABDOMEN LIMITED RIGHT UPPER QUADRANT COMPARISON:  CT scan 11/23/2019 FINDINGS: Gallbladder: Surgically absent. Common bile duct: Diameter: 5.9 mm Liver: Mildly heterogeneous increased echogenicity suggesting areas of fatty infiltration. No focal hepatic lesions or intrahepatic biliary dilatation. Portal vein is patent on color Doppler imaging with normal direction of blood flow towards the liver. Other: None. IMPRESSION: 1. Status post cholecystectomy.  No biliary dilatation. 2. Mild fatty infiltration of the liver but no hepatic lesions. Electronically Signed   By: Marijo Sanes M.D.   On: 11/24/2019 08:23      Impression / Plan:   Diana Ray is a 69 y.o. female with history of cholecystectomy who is admitted with acute pancreatitis and GI is consulted for elevated LFTs.  Ultrasound and CT did not reveal any biliary dilation.  Acute pancreatitis: differentials include neoplasm or microcholedocholithiasis or autoimmune  pancreatitis Aggressive IV hydration N.p.o. until pain subsides Antiemetics and analgesics as needed Monitor electrolytes, urine output closely  Check ANA, IgG4 levels Start clear liquid diet today  Elevated LFTs in setting of acute pancreatitis: With no imaging evidence of biliary obstruction or dilation, secondary to edema at the ampulla of vater.  If LFTs are persistently elevated, especially total bilirubin and/or alkaline phosphatase, recommend MRI/MRCP tomorrow.  If this is unremarkable, recommend endoscopic ultrasound as outpatient Monitor LFTs closely   Thank you for involving me in the care of this patient.  GI will follow along with you    LOS: 1 day   Sherri Sear, MD  11/24/2019, 1:26 PM   Note: This dictation was prepared with Dragon dictation along with smaller phrase technology. Any transcriptional errors that result from this process are unintentional.

## 2019-11-24 NOTE — Progress Notes (Signed)
[name]    MR#:  [id number]  DATE OF BIRTH:  October 09, 1951  SUBJECTIVE:  patient came in with abdominal pain epigastric right upper quadrant no nausea vomiting. Felt achy with fever at home. No fever today. Denies abdominal pain. Tolerated clear liquid diet. Never had pancreatitis in the past. Does not drink alcohol heavily.  REVIEW OF SYSTEMS:   Review of Systems  Constitutional: Negative for chills, fever and weight loss.  HENT: Negative for ear discharge, ear pain and nosebleeds.   Eyes: Negative for blurred vision, pain and discharge.  Respiratory: Negative for sputum production, shortness of breath, wheezing and stridor.   Cardiovascular: Negative for chest pain, palpitations, orthopnea and PND.  Gastrointestinal: Positive for abdominal pain and nausea. Negative for diarrhea.  Genitourinary: Negative for frequency and urgency.  Musculoskeletal: Negative for back pain and joint pain.  Neurological: Positive for weakness. Negative for sensory change, speech change and focal weakness.  Psychiatric/Behavioral: Negative for depression and hallucinations. The patient is not nervous/anxious.    Tolerating Diet:clears Tolerating PT: not needed  DRUG ALLERGIES:  No Known Allergies  VITALS:  Blood pressure 111/73, pulse 79, temperature 98.2 F (36.8 C), temperature source Oral, resp. rate 18, height 5\' 2"  (1.575 m), weight 92.6 kg, SpO2 96 %.  PHYSICAL EXAMINATION:   Physical Exam  GENERAL:  69 y.o.-year-old patient lying in the bed with no acute distress.  EYES: Pupils equal, round, reactive to light and accommodation. No scleral icterus.   HEENT: Head atraumatic, normocephalic. Oropharynx and nasopharynx clear.  NECK:  Supple, no jugular venous distention. No thyroid enlargement, no tenderness.  LUNGS: Normal breath sounds bilaterally, no wheezing, rales, rhonchi. No use of accessory muscles of  respiration.  CARDIOVASCULAR: S1, S2 normal. No murmurs, rubs, or gallops.  ABDOMEN: Soft, nontender, nondistended. Bowel sounds present. No organomegaly or mass.  EXTREMITIES: No cyanosis, clubbing or edema b/l.    NEUROLOGIC: Cranial nerves II through XII are intact. No focal Motor or sensory deficits b/l.   PSYCHIATRIC:  patient is alert and oriented x 3.  SKIN: No obvious rash, lesion, or ulcer.   LABORATORY PANEL:  CBC Recent Labs  Lab 11/23/19 1718  WBC 7.9  HGB 13.9  HCT 41.4  PLT 241    Chemistries  Recent Labs  Lab 11/23/19 1718 11/23/19 1718 11/24/19 0024  NA 135  --   --   K 3.2*  --   --   CL 99  --   --   CO2 26  --   --   GLUCOSE 110*  --   --   BUN 8  --   --   CREATININE 0.65  --   --   CALCIUM 8.7*  --   --   MG  --   --  1.8  AST 198*   < > 165*  ALT 391*   < > 339*  ALKPHOS 270*   < > 219*  BILITOT 5.1*   < > 4.3*   < > = values in this interval not displayed.   Cardiac Enzymes No results for input(s): TROPONINI in the last 168 hours. RADIOLOGY:  CT ABDOMEN PELVIS W CONTRAST  Result Date: 11/23/2019 CLINICAL DATA:  Left-sided abdominal pain and abdominal distention 2 days. EXAM: CT ABDOMEN AND PELVIS WITH CONTRAST TECHNIQUE: Multidetector CT imaging of the abdomen and pelvis was performed using the standard protocol following bolus administration of intravenous contrast. CONTRAST:  1108mL OMNIPAQUE IOHEXOL 300 MG/ML  SOLN COMPARISON:  None. FINDINGS: Lower Chest: No acute findings. Hepatobiliary: No hepatic masses identified. Prior cholecystectomy. No evidence of biliary obstruction. Pancreas:  No mass or inflammatory changes. Spleen: Within normal limits in size and appearance. Adrenals/Urinary Tract: No masses identified. No evidence of hydronephrosis. Unremarkable unopacified urinary bladder. Stomach/Bowel: No evidence of obstruction, inflammatory process or abnormal fluid collections. Normal appendix visualized. Diverticulosis is seen involving the  ascending and transverse colon, however there is no evidence of diverticulitis. Vascular/Lymphatic: No pathologically enlarged lymph nodes. No abdominal aortic aneurysm. Reproductive: Prior hysterectomy noted. Adnexal regions are unremarkable in appearance. Other:  None. Musculoskeletal: No suspicious bone lesions identified. Old L2 vertebral compression fracture and vertebroplasty noted. IMPRESSION: Colonic diverticulosis. No radiographic evidence of diverticulitis or other acute findings. Electronically Signed   By: Marlaine Hind M.D.   On: 11/23/2019 20:05   DG Chest Port 1 View  Result Date: 11/23/2019 CLINICAL DATA:  Fever. EXAM: PORTABLE CHEST 1 VIEW COMPARISON:  None. FINDINGS: The heart size and mediastinal contours are within normal limits. Both lungs are clear. The visualized skeletal structures are unremarkable. IMPRESSION: No active disease. Electronically Signed   By: Dorise Bullion III M.D   On: 11/23/2019 18:47   US Abdomen Limited RUQ  Result Date: 11/24/2019 CLINICAL DATA:  Right upper quadrant abdominal pain for 3 days. EXAM: ULTRASOUND ABDOMEN LIMITED RIGHT UPPER QUADRANT COMPARISON:  CT scan 11/23/2019 FINDINGS: Gallbladder: Surgically absent. Common bile duct: Diameter: 5.9 mm Liver: Mildly heterogeneous increased echogenicity suggesting areas of fatty infiltration. No focal hepatic lesions or intrahepatic biliary dilatation. Portal vein is patent on color Doppler imaging with normal direction of blood flow towards the liver. Other: None. IMPRESSION: 1. Status post cholecystectomy.  No biliary dilatation. 2. Mild fatty infiltration of the liver but no hepatic lesions. Electronically Signed   By: Marijo Sanes M.D.   On: 11/24/2019 08:23   ASSESSMENT AND PLAN:  Diana Ray  is a 69 y.o. Caucasian female with a known history of asthma, GERD, IBS and osteoarthritis, who presented to the emergency room with acute onset of epigastric and left upper quadrant abdominal pain with  associated fever at home that was up to 101 and chills.  Her pain has been radiating to her left lower quadrant  1. Acute pancreatitis.  unclear etiology -follow serial lipase levels 2600-->1000 -prn  Pain management -triglycerides wnl -LFT's trending down -total bilirubin 5.0--4.3 -USG and CT abdomen --non conclusive -Gi input by Dr Marius Ditch appreciated--MRCP in am and consider out pt EUS -pt has h/o GB removal  2. Elevated LFTs with jaundice.  Management as above.  3. Hypokalemia.  Potassium will be replaced and magnesium level will be checked.  4.  Asthma.  No current exacerbation continue Advair Diskus.  5.  Restless leg syndrome.  We will continue her Sinemet.  6.  Fibromyalgia.  We will hold her Cymbalta as it can cause elevated LFTs.  7.  DVT prophylaxis.  Subcutaneous Lovenox.  Procedures: Family communication :patient in the room Consults :GI Discharge Disposition :home CODE STATUS: FULL DVT Prophylaxis :lovenox Barriers to discharge: rx for pancreatitis  TOTAL TIME TAKING CARE OF THIS PATIENT: 30 minutes.  >50% time spent on counselling and coordination of care  Note: This dictation was prepared with Dragon dictation along with smaller phrase technology. Any transcriptional errors that result from this process are unintentional.  Fritzi Mandes M.D    [facility]   CC: Primary care physician; Kirk Ruths, MDPatient  ID: Diana Ray, female   DOB: 09-25-1951, 69 y.o.   MRN: [id number]

## 2019-11-25 ENCOUNTER — Other Ambulatory Visit: Payer: Self-pay

## 2019-11-25 ENCOUNTER — Telehealth: Payer: Self-pay

## 2019-11-25 ENCOUNTER — Inpatient Hospital Stay: Payer: Medicare PPO

## 2019-11-25 DIAGNOSIS — R17 Unspecified jaundice: Secondary | ICD-10-CM

## 2019-11-25 DIAGNOSIS — K8500 Idiopathic acute pancreatitis without necrosis or infection: Secondary | ICD-10-CM

## 2019-11-25 LAB — COMPREHENSIVE METABOLIC PANEL
ALT: 36 U/L (ref 0–44)
AST: 118 U/L — ABNORMAL HIGH (ref 15–41)
Albumin: 3.2 g/dL — ABNORMAL LOW (ref 3.5–5.0)
Alkaline Phosphatase: 249 U/L — ABNORMAL HIGH (ref 38–126)
Anion gap: 10 (ref 5–15)
BUN: <5 mg/dL — ABNORMAL LOW (ref 8–23)
CO2: 26 mmol/L (ref 22–32)
Calcium: 8.1 mg/dL — ABNORMAL LOW (ref 8.9–10.3)
Chloride: 104 mmol/L (ref 98–111)
Creatinine, Ser: 0.65 mg/dL (ref 0.44–1.00)
GFR calc Af Amer: >60 mL/min (ref >60)
GFR calc non Af Amer: >60 mL/min (ref >60)
Glucose, Bld: 113 mg/dL — ABNORMAL HIGH (ref 70–99)
Potassium: 3.8 mmol/L (ref 3.5–5.1)
Sodium: 140 mmol/L (ref 135–145)
Total Bilirubin: 3.4 mg/dL — ABNORMAL HIGH (ref 0.3–1.2)
Total Protein: 6.8 g/dL (ref 6.5–8.1)

## 2019-11-25 LAB — LIPASE, BLOOD: Lipase: 48 U/L (ref 11–51)

## 2019-11-25 LAB — ENA+DNA/DS+ANTICH+CENTRO+JO...
Anti JO-1: <0.2 AI (ref 0.0–0.9)
Centromere Ab Screen: <0.2 AI (ref 0.0–0.9)
Chromatin Ab SerPl-aCnc: 0.2 AI (ref 0.0–0.9)
ENA SM Ab Ser-aCnc: <0.2 AI (ref 0.0–0.9)
Ribonucleic Protein: 0.3 AI (ref 0.0–0.9)
SSA (Ro) (ENA) Antibody, IgG: <0.2 AI (ref 0.0–0.9)
SSB (La) (ENA) Antibody, IgG: <0.2 AI (ref 0.0–0.9)
Scleroderma (Scl-70) (ENA) Antibody, IgG: <0.2 AI (ref 0.0–0.9)
ds DNA Ab: 21 IU/mL — ABNORMAL HIGH (ref 0–9)

## 2019-11-25 LAB — ANA W/REFLEX IF POSITIVE: Anti Nuclear Antibody (ANA): POSITIVE — AB

## 2019-11-25 LAB — IGG 4: IgG, Subclass 4: 36 mg/dL (ref 2–96)

## 2019-11-25 MED ORDER — BOOST / RESOURCE BREEZE PO LIQD CUSTOM: 1.0000 | Freq: Three times a day (TID) | ORAL | Status: DC

## 2019-11-25 MED ORDER — GADOBUTROL 1 MMOL/ML IV SOLN
9.0000 mL | Freq: Once | INTRAVENOUS | Status: AC | PRN
  Administered 2019-11-25: 9 mL via INTRAVENOUS

## 2019-11-25 NOTE — Discharge Summary (Signed)
Diana Ray at [facility] NAME: Diana Ray    MR#:  [id number]  DATE OF BIRTH:  26-Apr-1951  DATE OF ADMISSION:  11/23/2019 ADMITTING PHYSICIAN: Diana Mormon, MD  DATE OF DISCHARGE: 11/25/2019  PRIMARY CARE PHYSICIAN: Diana Ruths, MD    ADMISSION DIAGNOSIS:  Acute pancreatitis [K85.90] RUQ pain [R10.11] Acute pancreatitis, unspecified complication status, unspecified pancreatitis type [K85.90]  DISCHARGE DIAGNOSIS:  Acute Pancreatitis--etio unclear--improved  SECONDARY DIAGNOSIS:   Past Medical History:  Diagnosis Date  . Allergy   . Arthritis    both knees, both hands and lumbar spine.  . Asthma   . GERD (gastroesophageal reflux disease)   . Headache, acute   . Hernia, hiatal   . IBS (irritable bowel syndrome)   . Myalgia   . Osteoarthritis of both knees   . Osteoporosis   . RLS (restless legs syndrome)   . Sleep apnea   . Zenker's diverticulum     HOSPITAL COURSE:   [name] a68 y.o.Caucasian femalewith a known history of asthma, GERD, IBS and osteoarthritis, who presented to the emergency room with acute onset of epigastric and left upper quadrant abdominal pain with associated fever at home that was up to 101and chills.Her pain has been radiating to her left lower quadrant  1. Acute pancreatitis.unclear etiology -follow serial lipase levels2600-->1000--> 48 -prnPain management -triglycerideswnl -LFT's trending down -total bilirubin 5.0--4.3--3.4 -USG and CT abdomen --non conclusive -Gi input by Dr Diana Ray appreciated - MRCP nothing acute--pt will get out pt EUS -pt has h/o GB removal -advance diet to low fat low carb. -Patient will discharged to home follow-up with Dr. Marius Ray as outpatient who is aware of it.  2. Elevated LFTs with jaundice.Management as above. -improving labs  3. Hypokalemia. -Potassium be replaced -K 3.8  4. Asthma. No current exacerbation  continue Advair Diskus.  5. Restless leg syndrome.  -continue her Sinemet.  6. Fibromyalgia.   7.DVT prophylaxis.Subcutaneous Lovenox.  Procedures:none Family communication:patient and husband in the room Consults:GI Discharge Disposition:home CODE STATUS:FULL DVT Prophylaxis:lovenox Barriers to discharge:none CONSULTS OBTAINED:  Treatment Team:  Diana Landsman, MD  DRUG ALLERGIES:  No Known Allergies  DISCHARGE MEDICATIONS:   Allergies as of 11/25/2019   No Known Allergies     Medication List    STOP taking these medications   HYDROcodone-acetaminophen 5-325 MG tablet Commonly known as: Norco   traMADol 50 MG tablet Commonly known as: ULTRAM     TAKE these medications   acetaminophen 500 MG tablet Commonly known as: TYLENOL Take 500 mg by mouth every 6 (six) hours as needed.   Advair Diskus 250-50 MCG/DOSE Aepb Generic drug: Fluticasone-Salmeterol INHALE ONE PUFF INTO THE LUNGS EVERY TWELVE HOURS   albuterol 108 (90 Base) MCG/ACT inhaler Commonly known as: VENTOLIN HFA Inhale 2 puffs into the lungs every 6 (six) hours as needed for wheezing or shortness of breath.   carbidopa-levodopa 25-100 MG tablet Commonly known as: SINEMET IR TAKE 1 OR 2 TABLETS BY MOUTH nightly   cetirizine 10 MG tablet Commonly known as: ZYRTEC Take 10 mg by mouth daily as needed.   DULoxetine 30 MG capsule Commonly known as: CYMBALTA Take 30 mg by mouth daily.   ergocalciferol 1.25 MG (50000 UT) capsule Commonly known as: VITAMIN D2 Take 50,000 Units by mouth once a week.   EXCEDRIN PO Take 1 tablet by mouth daily as needed.   methocarbamol 500 MG tablet Commonly known as: ROBAXIN Take 500 mg by mouth 3 (  three) times daily.   multivitamin tablet Take 1 tablet by mouth daily.   omeprazole 20 MG capsule Commonly known as: PRILOSEC Take 1 tablet by mouth once daily. Take 30 min before meals.       If you experience worsening of your  admission symptoms, develop shortness of breath, life threatening emergency, suicidal or homicidal thoughts you must seek medical attention immediately by calling 911 or calling your MD immediately  if symptoms less severe.  You Must read complete instructions/literature along with all the possible adverse reactions/side effects for all the Medicines you take and that have been prescribed to you. Take any new Medicines after you have completely understood and accept all the possible adverse reactions/side effects.   Please note  You were cared for by a hospitalist during your hospital stay. If you have any questions about your discharge medications or the care you received while you were in the hospital after you are discharged, you can call the unit and asked to speak with the hospitalist on call if the hospitalist that took care of you is not available. Once you are discharged, your primary care physician will handle any further medical issues. Please note that NO REFILLS for any discharge medications will be authorized once you are discharged, as it is imperative that you return to your primary care physician (or establish a relationship with a primary care physician if you do not have one) for your aftercare needs so that they can reassess your need for medications and monitor your lab values.  DATA REVIEW:   CBC  Recent Labs  Lab 11/23/19 1718  WBC 7.9  HGB 13.9  HCT 41.4  PLT 241    Chemistries  Recent Labs  Lab 11/23/19 1718 11/24/19 0024 11/25/19 0631  NA   < >  --  140  K   < >  --  3.8  CL   < >  --  104  CO2   < >  --  26  GLUCOSE   < >  --  113*  BUN   < >  --  <5*  CREATININE   < >  --  0.65  CALCIUM   < >  --  8.1*  MG  --  1.8  --   AST   < > 165* 118*  ALT   < > 339* 36  ALKPHOS   < > 219* 249*  BILITOT   < > 4.3* 3.4*   < > = values in this interval not displayed.    Microbiology Results   Recent Results (from the past 240 hour(s))  Urine Culture      Status: Abnormal   Collection Time: 11/23/19  5:19 PM   Specimen: Urine, Random  Result Value Ref Range Status   Specimen Description   Final    URINE, RANDOM Performed at [facility], [address], [place], [place] [address]    Special Requests   Final    NONE Performed at [facility], [address] [place],  [address]    Culture MULTIPLE SPECIES PRESENT, SUGGEST RECOLLECTION (A)  Final   Report Status 11/24/2019 FINAL  Final  Blood culture (routine x 2)     Status: None (Preliminary result)   Collection Time: 11/23/19  6:30 PM   Specimen: BLOOD  Result Value Ref Range Status   Specimen Description BLOOD RIGHT ANTECUBITAL  Final   Special Requests   Final    BOTTLES DRAWN  AEROBIC AND ANAEROBIC Blood Culture results may not be optimal due to an excessive volume of blood received in culture bottles   Culture   Final    NO GROWTH 2 DAYS Performed at [facility], [address] [place], [place] [address]    Report Status PENDING  Incomplete  Respiratory Panel by RT PCR (Flu A&B, Covid) - Nasopharyngeal Swab     Status: None   Collection Time: 11/23/19  6:32 PM   Specimen: Nasopharyngeal Swab  Result Value Ref Range Status   SARS Coronavirus 2 by RT PCR NEGATIVE NEGATIVE Final    Comment: (NOTE) SARS-CoV-2 target nucleic acids are NOT DETECTED. The SARS-CoV-2 RNA is generally detectable in upper respiratoy specimens during the acute phase of infection. The lowest concentration of SARS-CoV-2 viral copies this assay can detect is 131 copies/mL. A negative result does not preclude SARS-Cov-2 infection and should not be used as the sole basis for treatment or other patient management decisions. A negative result may occur with  improper specimen collection/handling, submission of specimen other than nasopharyngeal swab, presence of viral mutation(s) within the areas targeted by this assay, and inadequate number of viral copies (<131  copies/mL). A negative result must be combined with clinical observations, patient history, and epidemiological information. The expected result is Negative. Fact Sheet for Patients:  PinkCheek.be Fact Sheet for Healthcare Providers:  GravelBags.it This test is not yet ap proved or cleared by the [place] FDA and  has been authorized for detection and/or diagnosis of SARS-CoV-2 by FDA under an Emergency Use Authorization (EUA). This EUA will remain  in effect (meaning this test can be used) for the duration of the COVID-19 declaration under Section 564(b)(1) of the Act, 21 U.S.C. section 360bbb-3(b)(1), unless the authorization is terminated or revoked sooner.    Influenza A by PCR NEGATIVE NEGATIVE Final   Influenza B by PCR NEGATIVE NEGATIVE Final    Comment: (NOTE) The Xpert Xpress SARS-CoV-2/FLU/RSV assay is intended as an aid in  the diagnosis of influenza from Nasopharyngeal swab specimens and  should not be used as a sole basis for treatment. Nasal washings and  aspirates are unacceptable for Xpert Xpress SARS-CoV-2/FLU/RSV  testing. Fact Sheet for Patients: PinkCheek.be Fact Sheet for Healthcare Providers: GravelBags.it This test is not yet approved or cleared by the [place] FDA and  has been authorized for detection and/or diagnosis of SARS-CoV-2 by  FDA under an Emergency Use Authorization (EUA). This EUA will remain  in effect (meaning this test can be used) for the duration of the  Covid-19 declaration under Section 564(b)(1) of the Act, 21  U.S.C. section 360bbb-3(b)(1), unless the authorization is  terminated or revoked. Performed at [facility], [address] [place], [place] [address]   Blood culture (routine x 2)     Status: None (Preliminary result)   Collection Time: 11/23/19  6:32 PM   Specimen: BLOOD  Result Value Ref  Range Status   Specimen Description BLOOD LEFT ANTECUBITAL  Final   Special Requests   Final    BOTTLES DRAWN AEROBIC AND ANAEROBIC Blood Culture adequate volume   Culture   Final    NO GROWTH 2 DAYS Performed at [facility], [address], [place], [place] [address]    Report Status PENDING  Incomplete    RADIOLOGY:  CT ABDOMEN PELVIS W CONTRAST  Result Date: 11/23/2019 CLINICAL DATA:  Left-sided abdominal pain and abdominal distention 2 days. EXAM: CT ABDOMEN AND PELVIS WITH CONTRAST  TECHNIQUE: Multidetector CT imaging of the abdomen and pelvis was performed using the standard protocol following bolus administration of intravenous contrast. CONTRAST:  151mL OMNIPAQUE IOHEXOL 300 MG/ML  SOLN COMPARISON:  None. FINDINGS: Lower Chest: No acute findings. Hepatobiliary: No hepatic masses identified. Prior cholecystectomy. No evidence of biliary obstruction. Pancreas:  No mass or inflammatory changes. Spleen: Within normal limits in size and appearance. Adrenals/Urinary Tract: No masses identified. No evidence of hydronephrosis. Unremarkable unopacified urinary bladder. Stomach/Bowel: No evidence of obstruction, inflammatory process or abnormal fluid collections. Normal appendix visualized. Diverticulosis is seen involving the ascending and transverse colon, however there is no evidence of diverticulitis. Vascular/Lymphatic: No pathologically enlarged lymph nodes. No abdominal aortic aneurysm. Reproductive: Prior hysterectomy noted. Adnexal regions are unremarkable in appearance. Other:  None. Musculoskeletal: No suspicious bone lesions identified. Old L2 vertebral compression fracture and vertebroplasty noted. IMPRESSION: Colonic diverticulosis. No radiographic evidence of diverticulitis or other acute findings. Electronically Signed   By: Marlaine Hind M.D.   On: 11/23/2019 20:05   MR 3D Recon At Scanner  Result Date: 11/25/2019 CLINICAL DATA:  Epigastric abdominal pain, nausea and fever.  Pancreatitis. EXAM: MRI ABDOMEN WITHOUT AND WITH CONTRAST (INCLUDING MRCP) TECHNIQUE: Multiplanar multisequence MR imaging of the abdomen was performed both before and after the administration of intravenous contrast. Heavily T2-weighted images of the biliary and pancreatic ducts were obtained, and three-dimensional MRCP images were rendered by post processing. CONTRAST:  55mL GADAVIST GADOBUTROL 1 MMOL/ML IV SOLN COMPARISON:  CT scan 11/23/2019 FINDINGS: Lower chest: The lung bases are grossly clear. No pulmonary lesions or pleural effusions. No pericardial effusion. Hepatobiliary: Few tiny scattered hepatic cysts are noted but no worrisome hepatic lesions are identified. There is geographic fatty infiltration of the liver noted. The gallbladder is surgically absent. No biliary dilatation. Pancreas:  No mass, inflammation or ductal dilatation. Spleen:  Normal size. No focal lesions. Adrenals/Urinary Tract: The adrenal glands and kidneys are unremarkable. Stomach/Bowel: The stomach, duodenum, visualized small bowel and visualized colon are unremarkable. No acute inflammatory changes, mass lesions or obstructive findings. Two moderate-sized duodenal diverticulum are noted. Vascular/Lymphatic: The aorta and branch vessels are normal. The major venous structures are normal. No mesenteric or retroperitoneal mass or adenopathy. Other:  No ascites or abdominal wall hernia. Musculoskeletal: No significant bony findings. Remote compression fracture and vertebral augmentation changes at L2. IMPRESSION: 1. No acute abdominal findings, mass lesions or adenopathy. Normal appearance of the pancreas. 2. A few tiny scattered hepatic cysts but no worrisome hepatic lesions. 3. Geographic fatty infiltration of the liver. 4. Status post cholecystectomy. No biliary dilatation. 5. Two moderate-sized duodenal diverticulum. Electronically Signed   By: Marijo Sanes M.D.   On: 11/25/2019 08:43   DG Chest Port 1 View  Result Date:  11/23/2019 CLINICAL DATA:  Fever. EXAM: PORTABLE CHEST 1 VIEW COMPARISON:  None. FINDINGS: The heart size and mediastinal contours are within normal limits. Both lungs are clear. The visualized skeletal structures are unremarkable. IMPRESSION: No active disease. Electronically Signed   By: Dorise Bullion III M.D   On: 11/23/2019 18:47   MR ABDOMEN MRCP W WO CONTAST  Result Date: 11/25/2019 CLINICAL DATA:  Epigastric abdominal pain, nausea and fever. Pancreatitis. EXAM: MRI ABDOMEN WITHOUT AND WITH CONTRAST (INCLUDING MRCP) TECHNIQUE: Multiplanar multisequence MR imaging of the abdomen was performed both before and after the administration of intravenous contrast. Heavily T2-weighted images of the biliary and pancreatic ducts were obtained, and three-dimensional MRCP images were rendered by post processing. CONTRAST:  32mL GADAVIST GADOBUTROL 1  MMOL/ML IV SOLN COMPARISON:  CT scan 11/23/2019 FINDINGS: Lower chest: The lung bases are grossly clear. No pulmonary lesions or pleural effusions. No pericardial effusion. Hepatobiliary: Few tiny scattered hepatic cysts are noted but no worrisome hepatic lesions are identified. There is geographic fatty infiltration of the liver noted. The gallbladder is surgically absent. No biliary dilatation. Pancreas:  No mass, inflammation or ductal dilatation. Spleen:  Normal size. No focal lesions. Adrenals/Urinary Tract: The adrenal glands and kidneys are unremarkable. Stomach/Bowel: The stomach, duodenum, visualized small bowel and visualized colon are unremarkable. No acute inflammatory changes, mass lesions or obstructive findings. Two moderate-sized duodenal diverticulum are noted. Vascular/Lymphatic: The aorta and branch vessels are normal. The major venous structures are normal. No mesenteric or retroperitoneal mass or adenopathy. Other:  No ascites or abdominal wall hernia. Musculoskeletal: No significant bony findings. Remote compression fracture and vertebral augmentation  changes at L2. IMPRESSION: 1. No acute abdominal findings, mass lesions or adenopathy. Normal appearance of the pancreas. 2. A few tiny scattered hepatic cysts but no worrisome hepatic lesions. 3. Geographic fatty infiltration of the liver. 4. Status post cholecystectomy. No biliary dilatation. 5. Two moderate-sized duodenal diverticulum. Electronically Signed   By: Marijo Sanes M.D.   On: 11/25/2019 08:43   US Abdomen Limited RUQ  Result Date: 11/24/2019 CLINICAL DATA:  Right upper quadrant abdominal pain for 3 days. EXAM: ULTRASOUND ABDOMEN LIMITED RIGHT UPPER QUADRANT COMPARISON:  CT scan 11/23/2019 FINDINGS: Gallbladder: Surgically absent. Common bile duct: Diameter: 5.9 mm Liver: Mildly heterogeneous increased echogenicity suggesting areas of fatty infiltration. No focal hepatic lesions or intrahepatic biliary dilatation. Portal vein is patent on color Doppler imaging with normal direction of blood flow towards the liver. Other: None. IMPRESSION: 1. Status post cholecystectomy.  No biliary dilatation. 2. Mild fatty infiltration of the liver but no hepatic lesions. Electronically Signed   By: Marijo Sanes M.D.   On: 11/24/2019 08:23     CODE STATUS:  Advance Directive Documentation     Most Recent Value  Type of Advance Directive  Living will, Healthcare Power of Attorney  Pre-existing out of facility DNR order (yellow form or pink MOST form)  --  "MOST" Form in Place?  --       TOTAL TIME TAKING CARE OF THIS PATIENT: *35* minutes.    Fritzi Mandes M.D  [facility]    CC: Primary care physician; Diana Ruths, MD

## 2019-11-25 NOTE — Telephone Encounter (Signed)
-----   Message from Irving Copas., MD sent at 11/25/2019 12:02 PM EST ----- Regarding: RE: Referral for upper EUS Dr. Marius Ditch, Presentation interesting overall. Query microlithiasis without CBD dilation. EUS to evaluate that is reasonable and we can look at the pancreas as well. Would probably wait at least 2-3 weeks to ensure any pancreatitis continues to resolve (though imaging not so concerning). LFT pattern will be helpful to see next week as you plan. If the LFT pattern is increasing Dr. Marius Ditch, please let us know and we can certainly try to get her in earlier, but I would also say a hepatocellular workup for LFT evaluation would be reasonable as well.   Croix Presley, please schedule this patient for an EUS to rule out microlithiasis in 3-4 weeks with DJ or myself. DJ - FYI.  GM ----- Message ----- From: Lin Landsman, MD Sent: 11/25/2019  10:34 AM EST To: Milus Banister, MD, Darl Householder, RMA, # Subject: Referral for upper EUS                         Please place urgent referral to Drs Mansouraty or Ardis Hughs For upper EUS Dx: new onset of Jaundice, recent acute pancreatitis which resolved Unclear etiology, Imaging normal  Also, order for LFTs in 1 week  Thanks RV

## 2019-11-25 NOTE — Discharge Summary (Signed)
[facility] [place] NAME: Diana Ray    MR#:  [id number]  DATE OF BIRTH:  08-29-1951  SUBJECTIVE:  doing very well. No abdominal pain no nausea vomiting fever. Feels back to baseline  REVIEW OF SYSTEMS:   Review of Systems  Constitutional: Negative for chills, fever and weight loss.  HENT: Negative for ear discharge, ear pain and nosebleeds.   Eyes: Negative for blurred vision, pain and discharge.  Respiratory: Negative for sputum production, shortness of breath, wheezing and stridor.   Cardiovascular: Negative for chest pain, palpitations, orthopnea and PND.  Gastrointestinal: Negative for abdominal pain, diarrhea, nausea and vomiting.  Genitourinary: Negative for frequency and urgency.  Musculoskeletal: Negative for back pain and joint pain.  Neurological: Positive for weakness. Negative for sensory change, speech change and focal weakness.  Psychiatric/Behavioral: Negative for depression and hallucinations. The patient is not nervous/anxious.    Tolerating Diet: clears Tolerating PT: not needed  DRUG ALLERGIES:  No Known Allergies  VITALS:  Blood pressure 118/72, pulse 81, temperature 98.3 F (36.8 C), temperature source Oral, resp. rate 18, height 5\' 2"  (1.575 m), weight 92.6 kg, SpO2 97 %.  PHYSICAL EXAMINATION:   Physical Exam  GENERAL:  69 y.o.-year-old patient lying in the bed with no acute distress. obese EYES: Pupils equal, round, reactive to light and accommodation. No scleral icterus.   HEENT: Head atraumatic, normocephalic. Oropharynx and nasopharynx clear.  NECK:  Supple, no jugular venous distention. No thyroid enlargement, no tenderness.  LUNGS: Normal breath sounds bilaterally, no wheezing, rales, rhonchi. No use of accessory muscles of respiration.  CARDIOVASCULAR: S1, S2 normal. No murmurs, rubs, or gallops.  ABDOMEN: Soft, nontender, nondistended. Bowel sounds present. No organomegaly or mass.   EXTREMITIES: No cyanosis, clubbing or edema b/l.    NEUROLOGIC: Cranial nerves II through XII are intact. No focal Motor or sensory deficits b/l.   PSYCHIATRIC:  patient is alert and oriented x 3.  SKIN: No obvious rash, lesion, or ulcer.   LABORATORY PANEL:  CBC Recent Labs  Lab 11/23/19 1718  WBC 7.9  HGB 13.9  HCT 41.4  PLT 241    Chemistries  Recent Labs  Lab 11/23/19 1718 11/24/19 0024 11/25/19 0631  NA   < >  --  140  K   < >  --  3.8  CL   < >  --  104  CO2   < >  --  26  GLUCOSE   < >  --  113*  BUN   < >  --  <5*  CREATININE   < >  --  0.65  CALCIUM   < >  --  8.1*  MG  --  1.8  --   AST   < > 165* 118*  ALT   < > 339* 36  ALKPHOS   < > 219* 249*  BILITOT   < > 4.3* 3.4*   < > = values in this interval not displayed.   Cardiac Enzymes No results for input(s): TROPONINI in the last 168 hours. RADIOLOGY:  CT ABDOMEN PELVIS W CONTRAST  Result Date: 11/23/2019 CLINICAL DATA:  Left-sided abdominal pain and abdominal distention 2 days. EXAM: CT ABDOMEN AND PELVIS WITH CONTRAST TECHNIQUE: Multidetector CT imaging of the abdomen and pelvis was performed using the standard protocol following bolus administration of intravenous contrast. CONTRAST:  178mL OMNIPAQUE IOHEXOL 300 MG/ML  SOLN COMPARISON:  None. FINDINGS: Lower Chest: No acute findings. Hepatobiliary:  No hepatic masses identified. Prior cholecystectomy. No evidence of biliary obstruction. Pancreas:  No mass or inflammatory changes. Spleen: Within normal limits in size and appearance. Adrenals/Urinary Tract: No masses identified. No evidence of hydronephrosis. Unremarkable unopacified urinary bladder. Stomach/Bowel: No evidence of obstruction, inflammatory process or abnormal fluid collections. Normal appendix visualized. Diverticulosis is seen involving the ascending and transverse colon, however there is no evidence of diverticulitis. Vascular/Lymphatic: No pathologically enlarged lymph nodes. No abdominal  aortic aneurysm. Reproductive: Prior hysterectomy noted. Adnexal regions are unremarkable in appearance. Other:  None. Musculoskeletal: No suspicious bone lesions identified. Old L2 vertebral compression fracture and vertebroplasty noted. IMPRESSION: Colonic diverticulosis. No radiographic evidence of diverticulitis or other acute findings. Electronically Signed   By: Marlaine Hind M.D.   On: 11/23/2019 20:05   MR 3D Recon At Scanner  Result Date: 11/25/2019 CLINICAL DATA:  Epigastric abdominal pain, nausea and fever. Pancreatitis. EXAM: MRI ABDOMEN WITHOUT AND WITH CONTRAST (INCLUDING MRCP) TECHNIQUE: Multiplanar multisequence MR imaging of the abdomen was performed both before and after the administration of intravenous contrast. Heavily T2-weighted images of the biliary and pancreatic ducts were obtained, and three-dimensional MRCP images were rendered by post processing. CONTRAST:  24mL GADAVIST GADOBUTROL 1 MMOL/ML IV SOLN COMPARISON:  CT scan 11/23/2019 FINDINGS: Lower chest: The lung bases are grossly clear. No pulmonary lesions or pleural effusions. No pericardial effusion. Hepatobiliary: Few tiny scattered hepatic cysts are noted but no worrisome hepatic lesions are identified. There is geographic fatty infiltration of the liver noted. The gallbladder is surgically absent. No biliary dilatation. Pancreas:  No mass, inflammation or ductal dilatation. Spleen:  Normal size. No focal lesions. Adrenals/Urinary Tract: The adrenal glands and kidneys are unremarkable. Stomach/Bowel: The stomach, duodenum, visualized small bowel and visualized colon are unremarkable. No acute inflammatory changes, mass lesions or obstructive findings. Two moderate-sized duodenal diverticulum are noted. Vascular/Lymphatic: The aorta and branch vessels are normal. The major venous structures are normal. No mesenteric or retroperitoneal mass or adenopathy. Other:  No ascites or abdominal wall hernia. Musculoskeletal: No significant  bony findings. Remote compression fracture and vertebral augmentation changes at L2. IMPRESSION: 1. No acute abdominal findings, mass lesions or adenopathy. Normal appearance of the pancreas. 2. A few tiny scattered hepatic cysts but no worrisome hepatic lesions. 3. Geographic fatty infiltration of the liver. 4. Status post cholecystectomy. No biliary dilatation. 5. Two moderate-sized duodenal diverticulum. Electronically Signed   By: Marijo Sanes M.D.   On: 11/25/2019 08:43   DG Chest Port 1 View  Result Date: 11/23/2019 CLINICAL DATA:  Fever. EXAM: PORTABLE CHEST 1 VIEW COMPARISON:  None. FINDINGS: The heart size and mediastinal contours are within normal limits. Both lungs are clear. The visualized skeletal structures are unremarkable. IMPRESSION: No active disease. Electronically Signed   By: Dorise Bullion III M.D   On: 11/23/2019 18:47   MR ABDOMEN MRCP W WO CONTAST  Result Date: 11/25/2019 CLINICAL DATA:  Epigastric abdominal pain, nausea and fever. Pancreatitis. EXAM: MRI ABDOMEN WITHOUT AND WITH CONTRAST (INCLUDING MRCP) TECHNIQUE: Multiplanar multisequence MR imaging of the abdomen was performed both before and after the administration of intravenous contrast. Heavily T2-weighted images of the biliary and pancreatic ducts were obtained, and three-dimensional MRCP images were rendered by post processing. CONTRAST:  62mL GADAVIST GADOBUTROL 1 MMOL/ML IV SOLN COMPARISON:  CT scan 11/23/2019 FINDINGS: Lower chest: The lung bases are grossly clear. No pulmonary lesions or pleural effusions. No pericardial effusion. Hepatobiliary: Few tiny scattered hepatic cysts are noted but no worrisome hepatic lesions are  identified. There is geographic fatty infiltration of the liver noted. The gallbladder is surgically absent. No biliary dilatation. Pancreas:  No mass, inflammation or ductal dilatation. Spleen:  Normal size. No focal lesions. Adrenals/Urinary Tract: The adrenal glands and kidneys are unremarkable.  Stomach/Bowel: The stomach, duodenum, visualized small bowel and visualized colon are unremarkable. No acute inflammatory changes, mass lesions or obstructive findings. Two moderate-sized duodenal diverticulum are noted. Vascular/Lymphatic: The aorta and branch vessels are normal. The major venous structures are normal. No mesenteric or retroperitoneal mass or adenopathy. Other:  No ascites or abdominal wall hernia. Musculoskeletal: No significant bony findings. Remote compression fracture and vertebral augmentation changes at L2. IMPRESSION: 1. No acute abdominal findings, mass lesions or adenopathy. Normal appearance of the pancreas. 2. A few tiny scattered hepatic cysts but no worrisome hepatic lesions. 3. Geographic fatty infiltration of the liver. 4. Status post cholecystectomy. No biliary dilatation. 5. Two moderate-sized duodenal diverticulum. Electronically Signed   By: Marijo Sanes M.D.   On: 11/25/2019 08:43   US Abdomen Limited RUQ  Result Date: 11/24/2019 CLINICAL DATA:  Right upper quadrant abdominal pain for 3 days. EXAM: ULTRASOUND ABDOMEN LIMITED RIGHT UPPER QUADRANT COMPARISON:  CT scan 11/23/2019 FINDINGS: Gallbladder: Surgically absent. Common bile duct: Diameter: 5.9 mm Liver: Mildly heterogeneous increased echogenicity suggesting areas of fatty infiltration. No focal hepatic lesions or intrahepatic biliary dilatation. Portal vein is patent on color Doppler imaging with normal direction of blood flow towards the liver. Other: None. IMPRESSION: 1. Status post cholecystectomy.  No biliary dilatation. 2. Mild fatty infiltration of the liver but no hepatic lesions. Electronically Signed   By: Marijo Sanes M.D.   On: 11/24/2019 08:23   ASSESSMENT AND PLAN:  [name] a68 y.o.Caucasian femalewith a known history of asthma, GERD, IBS and osteoarthritis, who presented to the emergency room with acute onset of epigastric and left upper quadrant abdominal pain with associated fever  at home that was up to 101and chills.Her pain has been radiating to her left lower quadrant  1. Acute pancreatitis. unclear etiology -follow serial lipase levels 2600-->1000--> 48 -prn Pain management -triglycerides wnl -LFT's trending down -total bilirubin 5.0--4.3--3.4 -USG and CT abdomen --non conclusive -Gi input by Dr Marius Ditch appreciated - MRCP nothing acute--pt will get out pt EUS -pt has h/o GB removal -advance diet to low fat low carb. -Patient will discharged to home follow-up with Dr. Marius Ditch as outpatient who is aware of it.  2. Elevated LFTs with jaundice.Management as above. -improving labs  3. Hypokalemia. -Potassium be replaced -K 3.8  4. Asthma. No current exacerbation continue Advair Diskus.  5. Restless leg syndrome.  -continue her Sinemet.  6. Fibromyalgia.   7.DVT prophylaxis.Subcutaneous Lovenox.  Procedures:none Family communication :patient and husband in the room Consults :GI Discharge Disposition :home CODE STATUS: FULL DVT Prophylaxis :lovenox Barriers to discharge: none  TOTAL TIME TAKING CARE OF THIS PATIENT: *25* minutes.  >50% time spent on counselling and coordination of care  Note: This dictation was prepared with Dragon dictation along with smaller phrase technology. Any transcriptional errors that result from this process are unintentional.  Fritzi Mandes M.D    [facility]   CC: Primary care physician; Kirk Ruths, MD

## 2019-11-25 NOTE — Progress Notes (Signed)
Diana Ray to be D/C'd home with husband per MD order.  Discussed prescriptions and follow up appointments with the patient. Prescriptions given to patient, medication list explained in detail. Pt verbalized understanding.  Allergies as of 11/25/2019   No Known Allergies      Medication List     STOP taking these medications    HYDROcodone-acetaminophen 5-325 MG tablet Commonly known as: Norco   traMADol 50 MG tablet Commonly known as: ULTRAM       TAKE these medications    acetaminophen 500 MG tablet Commonly known as: TYLENOL Take 500 mg by mouth every 6 (six) hours as needed.   Advair Diskus 250-50 MCG/DOSE Aepb Generic drug: Fluticasone-Salmeterol INHALE ONE PUFF INTO THE LUNGS EVERY TWELVE HOURS   albuterol 108 (90 Base) MCG/ACT inhaler Commonly known as: VENTOLIN HFA Inhale 2 puffs into the lungs every 6 (six) hours as needed for wheezing or shortness of breath.   carbidopa-levodopa 25-100 MG tablet Commonly known as: SINEMET IR TAKE 1 OR 2 TABLETS BY MOUTH nightly   cetirizine 10 MG tablet Commonly known as: ZYRTEC Take 10 mg by mouth daily as needed.   DULoxetine 30 MG capsule Commonly known as: CYMBALTA Take 30 mg by mouth daily.   ergocalciferol 1.25 MG (50000 UT) capsule Commonly known as: VITAMIN D2 Take 50,000 Units by mouth once a week.   EXCEDRIN PO Take 1 tablet by mouth daily as needed.   methocarbamol 500 MG tablet Commonly known as: ROBAXIN Take 500 mg by mouth 3 (three) times daily.   multivitamin tablet Take 1 tablet by mouth daily.   omeprazole 20 MG capsule Commonly known as: PRILOSEC Take 1 tablet by mouth once daily. Take 30 min before meals.        Vitals:   11/24/19 2016 11/25/19 0446  BP:  118/72  Pulse:  81  Resp:  18  Temp: 98.1 F (36.7 C) 98.3 F (36.8 C)  SpO2:  97%    Skin clean, dry and intact without evidence of skin break down, no evidence of skin tears noted. IV catheter discontinued intact.  Site without signs and symptoms of complications. Dressing and pressure applied. Pt denies pain at this time. No complaints noted.  An After Visit Summary was printed and given to the patient. Patient escorted via [place], and D/C home via private auto.  Bressler A Elsye Mccollister

## 2019-11-25 NOTE — Discharge Instructions (Signed)
Acute Pancreatitis  Acute pancreatitis happens when the pancreas gets swollen. The pancreas is a large gland in the body that helps to control blood sugar. It also makes enzymes that help to digest food. This condition can last a few days and cause serious problems. The lungs, heart, and kidneys may stop working. What are the causes? Causes include:  Alcohol abuse.  Drug abuse.  Gallstones.  A tumor in the pancreas. Other causes include:  Some medicines.  Some chemicals.  Diabetes.  An infection.  Damage caused by an accident.  The poison (venom) from a scorpion bite.  Belly (abdominal) surgery.  The body's defense system (immune system) attacking the pancreas (autoimmune pancreatitis).  Genes that are passed from parent to child (inherited). In some cases, the cause is not known. What are the signs or symptoms?  Pain in the upper belly that may be felt in the back. The pain may be very bad.  Swelling of the belly.  Feeling sick to your stomach (nauseous) and throwing up (vomiting).  Fever. How is this treated? You will likely have to stay in the hospital. Treatment may include:  Pain medicine.  Fluid through an IV tube.  Placing a tube in the stomach to take out the stomach contents. This may help you stop throwing up.  Not eating for 3-4 days.  Antibiotic medicines, if you have an infection.  Treating any other problems that may be the cause.  Steroid medicines, if your problem is caused by your defense system attacking your body's own tissues.  Surgery. Follow these instructions at home: Eating and drinking   Follow instructions from your doctor about what to eat and drink.  Eat foods that do not have a lot of fat in them.  Eat small meals often. Do not eat big meals.  Drink enough fluid to keep your pee (urine) pale yellow.  Do not drink alcohol if it caused your condition. Medicines  Take over-the-counter and prescription medicines  only as told by your doctor.  Ask your doctor if the medicine prescribed to you: ? Requires you to avoid driving or using heavy machinery. ? Can cause trouble pooping (constipation). You may need to take steps to prevent or treat trouble pooping:  Take over-the-counter or prescription medicines.  Eat foods that are high in fiber. These include beans, whole grains, and fresh fruits and vegetables.  Limit foods that are high in fat and sugar. These include fried or sweet foods. General instructions  Do not use any products that contain nicotine or tobacco, such as cigarettes, e-cigarettes, and chewing tobacco. If you need help quitting, ask your doctor.  Get plenty of rest.  Check your blood sugar at home as told by your doctor.  Keep all follow-up visits as told by your doctor. This is important. Contact a doctor if:  You do not get better as quickly as expected.  You have new symptoms.  Your symptoms get worse.  You have pain or weakness that lasts a long time.  You keep feeling sick to your stomach.  You get better and then you have pain again.  You have a fever. Get help right away if:  You cannot eat or keep fluids down.  Your pain gets very bad.  Your skin or the white part of your eyes turns yellow.  You have sudden swelling in your belly.  You throw up.  You feel dizzy or you pass out (faint).  Your blood sugar is high (over  300 mg/dL). Summary  Acute pancreatitis happens when the pancreas gets swollen.  This condition is often caused by alcohol abuse, drug abuse, or gallstones.  You will likely have to stay in the hospital for treatment. This information is not intended to replace advice given to you by your health care provider. Make sure you discuss any questions you have with your health care provider. Document Revised: 07/29/2018 Document Reviewed: 07/29/2018 Elsevier Patient Education  [place] fat/low carb diet patient is  advised to follow-up with Dr. Marius Ditch as outpatient to get endoscopy ultrasound scheduled.

## 2019-11-25 NOTE — Telephone Encounter (Signed)
Lin Landsman, MD  Mansouraty, Telford Nab., MD; Darl Householder, RMA; Timothy Lasso, RN  Cc: Milus Banister, MD  Agree, I was suspecting microcholedocho in the setting of acute pancreatitis and jaundice  ANA pending, IgG4 normal  She also has underlying fatty liver  Will do more workup based on her repeat LFTs   Thanks  Rohini

## 2019-11-28 ENCOUNTER — Other Ambulatory Visit: Payer: Self-pay

## 2019-11-28 DIAGNOSIS — K831 Obstruction of bile duct: Secondary | ICD-10-CM

## 2019-11-28 DIAGNOSIS — R7989 Other specified abnormal findings of blood chemistry: Secondary | ICD-10-CM

## 2019-11-28 DIAGNOSIS — K8500 Idiopathic acute pancreatitis without necrosis or infection: Secondary | ICD-10-CM

## 2019-11-28 LAB — CULTURE, BLOOD (ROUTINE X 2)
Culture: NO GROWTH
Culture: NO GROWTH
Special Requests: ADEQUATE

## 2019-11-28 NOTE — Telephone Encounter (Signed)
EUS scheduled for 12/17/19 at 1030 am [facility] with Dr Rush Landmark COVID testing scheduled for 2/20.

## 2019-11-28 NOTE — Telephone Encounter (Signed)
EUS scheduled, pt instructed and medications reviewed.  Patient instructions mailed to home.  Patient to call with any questions or concerns. COVID appt given and understanding verbalized.

## 2019-12-13 ENCOUNTER — Other Ambulatory Visit (HOSPITAL_COMMUNITY)
Admission: RE | Admit: 2019-12-13 | Discharge: 2019-12-13 | Disposition: A | Discharge status: Home / Self Care | Payer: Medicare PPO | Source: Ambulatory Visit | Attending: Gastroenterology | Admitting: Gastroenterology

## 2019-12-13 DIAGNOSIS — Z01812 Encounter for preprocedural laboratory examination: Secondary | ICD-10-CM | POA: Insufficient documentation

## 2019-12-13 DIAGNOSIS — Z20822 Contact with and (suspected) exposure to covid-19: Secondary | ICD-10-CM | POA: Diagnosis not present

## 2019-12-13 LAB — SARS CORONAVIRUS 2 (TAT 6-24 HRS): SARS Coronavirus 2: NEGATIVE

## 2019-12-17 ENCOUNTER — Encounter (HOSPITAL_COMMUNITY)
Admission: RE | Disposition: A | Discharge status: Home / Self Care | Payer: Self-pay | Source: Home / Self Care | Attending: Gastroenterology

## 2019-12-17 ENCOUNTER — Other Ambulatory Visit: Payer: Self-pay

## 2019-12-17 ENCOUNTER — Ambulatory Visit (HOSPITAL_COMMUNITY): Payer: Medicare PPO | Admitting: Registered Nurse

## 2019-12-17 ENCOUNTER — Encounter (HOSPITAL_COMMUNITY): Payer: Self-pay | Admitting: Gastroenterology

## 2019-12-17 ENCOUNTER — Ambulatory Visit (HOSPITAL_COMMUNITY)
Admission: RE | Admit: 2019-12-17 | Discharge: 2019-12-17 | Disposition: A | Discharge status: Home / Self Care | Payer: Medicare PPO | Attending: Gastroenterology | Admitting: Gastroenterology

## 2019-12-17 DIAGNOSIS — K222 Esophageal obstruction: Secondary | ICD-10-CM | POA: Insufficient documentation

## 2019-12-17 DIAGNOSIS — R748 Abnormal levels of other serum enzymes: Secondary | ICD-10-CM | POA: Insufficient documentation

## 2019-12-17 DIAGNOSIS — R590 Localized enlarged lymph nodes: Secondary | ICD-10-CM | POA: Diagnosis not present

## 2019-12-17 DIAGNOSIS — J45909 Unspecified asthma, uncomplicated: Secondary | ICD-10-CM | POA: Insufficient documentation

## 2019-12-17 DIAGNOSIS — Z79899 Other long term (current) drug therapy: Secondary | ICD-10-CM | POA: Diagnosis not present

## 2019-12-17 DIAGNOSIS — K219 Gastro-esophageal reflux disease without esophagitis: Secondary | ICD-10-CM | POA: Insufficient documentation

## 2019-12-17 DIAGNOSIS — R7989 Other specified abnormal findings of blood chemistry: Secondary | ICD-10-CM

## 2019-12-17 DIAGNOSIS — Q398 Other congenital malformations of esophagus: Secondary | ICD-10-CM | POA: Insufficient documentation

## 2019-12-17 DIAGNOSIS — K831 Obstruction of bile duct: Secondary | ICD-10-CM

## 2019-12-17 DIAGNOSIS — G2581 Restless legs syndrome: Secondary | ICD-10-CM | POA: Insufficient documentation

## 2019-12-17 DIAGNOSIS — K449 Diaphragmatic hernia without obstruction or gangrene: Secondary | ICD-10-CM | POA: Diagnosis not present

## 2019-12-17 DIAGNOSIS — Z8719 Personal history of other diseases of the digestive system: Secondary | ICD-10-CM | POA: Diagnosis not present

## 2019-12-17 DIAGNOSIS — G4730 Sleep apnea, unspecified: Secondary | ICD-10-CM | POA: Diagnosis not present

## 2019-12-17 DIAGNOSIS — K225 Diverticulum of esophagus, acquired: Secondary | ICD-10-CM | POA: Diagnosis not present

## 2019-12-17 DIAGNOSIS — K8500 Idiopathic acute pancreatitis without necrosis or infection: Secondary | ICD-10-CM

## 2019-12-17 DIAGNOSIS — K5710 Diverticulosis of small intestine without perforation or abscess without bleeding: Secondary | ICD-10-CM | POA: Insufficient documentation

## 2019-12-17 DIAGNOSIS — K859 Acute pancreatitis without necrosis or infection, unspecified: Secondary | ICD-10-CM | POA: Diagnosis not present

## 2019-12-17 DIAGNOSIS — Z7951 Long term (current) use of inhaled steroids: Secondary | ICD-10-CM | POA: Diagnosis not present

## 2019-12-17 HISTORY — PX: EUS: SHX5427

## 2019-12-17 HISTORY — PX: ESOPHAGOGASTRODUODENOSCOPY (EGD) WITH PROPOFOL: SHX5813

## 2019-12-17 SURGERY — UPPER ENDOSCOPIC ULTRASOUND (EUS) RADIAL
Anesthesia: Monitor Anesthesia Care

## 2019-12-17 MED ORDER — LIDOCAINE 2% (20 MG/ML) 5 ML SYRINGE
INTRAMUSCULAR | Status: DC | PRN
  Administered 2019-12-17: 100 mg via INTRAVENOUS

## 2019-12-17 MED ORDER — PROPOFOL 10 MG/ML IV BOLUS
INTRAVENOUS | Status: DC | PRN
  Administered 2019-12-17 (×3): 20 mg via INTRAVENOUS
  Administered 2019-12-17: 50 mg via INTRAVENOUS

## 2019-12-17 MED ORDER — PROPOFOL 500 MG/50ML IV EMUL
INTRAVENOUS | Status: AC
  Filled 2019-12-17: qty 50

## 2019-12-17 MED ORDER — PROPOFOL 500 MG/50ML IV EMUL
INTRAVENOUS | Status: DC | PRN
  Administered 2019-12-17: 125 ug/kg/min via INTRAVENOUS

## 2019-12-17 MED ORDER — LACTATED RINGERS IV SOLN
INTRAVENOUS | Status: DC
  Administered 2019-12-17: 1000 mL via INTRAVENOUS

## 2019-12-17 MED ORDER — SODIUM CHLORIDE 0.9 % IV SOLN: INTRAVENOUS | Status: DC

## 2019-12-17 MED ORDER — OMEPRAZOLE 40 MG PO CPDR: 40.0000 mg | DELAYED_RELEASE_CAPSULE | Freq: Every day | ORAL | 3 refills | Status: AC

## 2019-12-17 SURGICAL SUPPLY — 15 items

## 2019-12-17 NOTE — Anesthesia Postprocedure Evaluation (Signed)
Anesthesia Post Note  Patient: Diana Ray  Procedure(s) Performed: UPPER ENDOSCOPIC ULTRASOUND (EUS) RADIAL (N/A ) ESOPHAGOGASTRODUODENOSCOPY (EGD) WITH PROPOFOL (N/A )     Patient location during evaluation: PACU Anesthesia Type: MAC Level of consciousness: awake and alert Pain management: pain level controlled Vital Signs Assessment: post-procedure vital signs reviewed and stable Respiratory status: spontaneous breathing, nonlabored ventilation, respiratory function stable and patient connected to nasal cannula oxygen Cardiovascular status: stable and blood pressure returned to baseline Postop Assessment: no apparent nausea or vomiting Anesthetic complications: no    Last Vitals:  Vitals:   12/17/19 1240 12/17/19 1250  BP: 119/69 114/67  Pulse: 78 79  Resp: 15 20  Temp:    SpO2: 96% 96%    Last Pain:  Vitals:   12/17/19 1250  TempSrc:   PainSc: 0-No pain                 Tiajuana Amass

## 2019-12-17 NOTE — Transfer of Care (Signed)
Immediate Anesthesia Transfer of Care Note  Patient: Diana Ray  Procedure(s) Performed: UPPER ENDOSCOPIC ULTRASOUND (EUS) RADIAL (N/A ) ESOPHAGOGASTRODUODENOSCOPY (EGD) WITH PROPOFOL (N/A )  Patient Location: PACU  Anesthesia Type:MAC  Level of Consciousness: sedated  Airway & Oxygen Therapy: Patient Spontanous Breathing and Patient connected to face mask oxygen  Post-op Assessment: Report given to RN and Post -op Vital signs reviewed and stable  Post vital signs: Reviewed and stable  Last Vitals:  Vitals Value Taken Time  BP    Temp    Pulse 98 12/17/19 1202  Resp 25 12/17/19 1202  SpO2 97 % 12/17/19 1202  Vitals shown include unvalidated device data.  Last Pain:  Vitals:   12/17/19 0955  TempSrc: Oral         Complications: No apparent anesthesia complications

## 2019-12-17 NOTE — H&P (Signed)
GASTROENTEROLOGY PROCEDURE H&P NOTE   Primary Care Physician: Kirk Ruths, MD  HPI: Diana Ray is a 69 y.o. female who presents for EUS after recent Pancreatitis to rule out retained choledocholithiasis or other etiologies for pancreatitis.  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  Past Medical History:  Diagnosis Date  . Allergy   . Arthritis    both knees, both hands and lumbar spine.  . Asthma   . GERD (gastroesophageal reflux disease)   . Headache, acute   . Hernia, hiatal   . IBS (irritable bowel syndrome)   . Myalgia   . Osteoarthritis of both knees   . Osteoporosis   . RLS (restless legs syndrome)   . Sleep apnea   . Zenker's diverticulum    Past Surgical History:  Procedure Laterality Date  . ABDOMINAL HYSTERECTOMY  2006  . CHOLECYSTECTOMY  2006  . COLONOSCOPY  2015  . COLONOSCOPY WITH PROPOFOL N/A 07/22/2019   Procedure: COLONOSCOPY WITH PROPOFOL;  Surgeon: Lollie Sails, MD;  Location: [facility] ENDOSCOPY;  Service: Endoscopy;  Laterality: N/A;  . ESOPHAGOGASTRODUODENOSCOPY    . ESOPHAGOGASTRODUODENOSCOPY (EGD) WITH PROPOFOL N/A 01/09/2018   Procedure: ESOPHAGOGASTRODUODENOSCOPY (EGD) WITH PROPOFOL;  Surgeon: Toledo, Benay Pike, MD;  Location: [facility] ENDOSCOPY;  Service: Gastroenterology;  Laterality: N/A;  . KYPHOPLASTY N/A 05/30/2018   Procedure: Hewitt Shorts;  Surgeon: Hessie Knows, MD;  Location: [facility] ORS;  Service: Orthopedics;  Laterality: N/A;  . KYPHOPLASTY    . LIPOMA EXCISION  05/2017   back of neck  . MENISCUS REPAIR Bilateral 2010,2011   Current Facility-Administered Medications  Medication Dose Route Frequency Provider Last Rate Last Admin  . 0.9 %  sodium chloride infusion   Intravenous Continuous Mansouraty, Telford Nab., MD      . lactated ringers infusion   Intravenous Continuous Mansouraty, Telford Nab., MD 10 mL/hr at 12/17/19 1006 1,000 mL at 12/17/19 1006   No Known Allergies Family History  Problem Relation Age  of Onset  . Heart disease Mother   . Heart disease Father   . Diabetes Brother   . Breast cancer Neg Hx    Social History   Socioeconomic History  . Marital status: Married    Spouse name: Shanon Brow (Gene)  . Number of children: Not on file  . Years of education: Not on file  . Highest education level: Not on file  Occupational History  . Not on file  Tobacco Use  . Smoking status: Never Smoker  . Smokeless tobacco: Never Used  Substance and Sexual Activity  . Alcohol use: Yes    Alcohol/week: 9.0 standard drinks    Types: 4 Standard drinks or equivalent, 5 Glasses of wine per week  . Drug use: No  . Sexual activity: Never  Other Topics Concern  . Not on file  Social History Narrative  . Not on file   Social Determinants of Health   Financial Resource Strain:   . Difficulty of Paying Living Expenses: Not on file  Food Insecurity:   . Worried About Charity fundraiser in the Last Year: Not on file  . Ran Out of Food in the Last Year: Not on file  Transportation Needs:   . Lack of Transportation (Medical): Not on file  . Lack of Transportation (Non-Medical): Not on file  Physical Activity:   . Days of Exercise per Week: Not on file  . Minutes of Exercise per Session: Not on file  Stress:   . Feeling of Stress :  Not on file  Social Connections:   . Frequency of Communication with Friends and Family: Not on file  . Frequency of Social Gatherings with Friends and Family: Not on file  . Attends Religious Services: Not on file  . Active Member of Clubs or Organizations: Not on file  . Attends Archivist Meetings: Not on file  . Marital Status: Not on file  Intimate Partner Violence:   . Fear of Current or Ex-Partner: Not on file  . Emotionally Abused: Not on file  . Physically Abused: Not on file  . Sexually Abused: Not on file    Physical Exam: Vital signs in last 24 hours: Temp:  [99 F (37.2 C)] 99 F (37.2 C) (02/24 0955) Pulse Rate:  [87] 87 (02/24  0955) Resp:  [16] 16 (02/24 0955) BP: (128)/(81) 128/81 (02/24 0955) SpO2:  [95 %] 95 % (02/24 0955)   GEN: NAD EYE: Sclerae anicteric ENT: MMM CV: Non-tachycardic GI: Soft, minimal TTP in midabdomen NEURO:  Alert & Oriented x 3  Lab Results: No results for input(s): WBC, HGB, HCT, PLT in the last 72 hours. BMET No results for input(s): NA, K, CL, CO2, GLUCOSE, BUN, CREATININE, CALCIUM in the last 72 hours. LFT No results for input(s): PROT, ALBUMIN, AST, ALT, ALKPHOS, BILITOT, BILIDIR, IBILI in the last 72 hours. PT/INR No results for input(s): LABPROT, INR in the last 72 hours.   Impression / Plan: This is a 69 y.o.female who presents for EUS after recent Pancreatitis to rule out retained choledocholithiasis or other etiologies for pancreatitis.  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   The risks and benefits of endoscopic evaluation were discussed with the patient; these include but are not limited to the risk of perforation, infection, bleeding, missed lesions, lack of diagnosis, severe illness requiring hospitalization, as well as anesthesia and sedation related illnesses.  The patient is agreeable to proceed.    Justice Britain, MD [facility] Office # [phone]

## 2019-12-17 NOTE — Anesthesia Procedure Notes (Signed)
Procedure Name: MAC Date/Time: 12/17/2019 11:08 AM Performed by: Deliah Boston, CRNA Pre-anesthesia Checklist: Patient identified, Emergency Drugs available, Suction available and Patient being monitored Patient Re-evaluated:Patient Re-evaluated prior to induction Oxygen Delivery Method: Simple face mask Preoxygenation: Pre-oxygenation with 100% oxygen Placement Confirmation: positive ETCO2

## 2019-12-17 NOTE — Discharge Instructions (Signed)
YOU HAD AN ENDOSCOPIC PROCEDURE TODAY: Refer to the procedure report and other information in the discharge instructions given to you for any specific questions about what was found during the examination. If this information does not answer your questions, please call [place] office at [phone] to clarify.   YOU SHOULD EXPECT: Some feelings of bloating in the abdomen. Passage of more gas than usual. Walking can help get rid of the air that was put into your GI tract during the procedure and reduce the bloating. If you had a lower endoscopy (such as a colonoscopy or flexible sigmoidoscopy) you may notice spotting of blood in your stool or on the toilet paper. Some abdominal soreness may be present for a day or two, also.  DIET: Your first meal following the procedure should be a light meal and then it is ok to progress to your normal diet. A half-sandwich or bowl of soup is an example of a good first meal. Heavy or fried foods are harder to digest and may make you feel nauseous or bloated. Drink plenty of fluids but you should avoid alcoholic beverages for 24 hours. If you had a esophageal dilation, please see attached instructions for diet.    ACTIVITY: Your care partner should take you home directly after the procedure. You should plan to take it easy, moving slowly for the rest of the day. You can resume normal activity the day after the procedure however YOU SHOULD NOT DRIVE, use power tools, machinery or perform tasks that involve climbing or major physical exertion for 24 hours (because of the sedation medicines used during the test).   SYMPTOMS TO REPORT IMMEDIATELY: A gastroenterologist can be reached at any hour. Please call [phone]  for any of the following symptoms:   Following upper endoscopy (EGD, EUS, ERCP, esophageal dilation) Vomiting of blood or coffee ground material  New, significant abdominal pain  New, significant chest pain or pain under the shoulder blades  Painful or  persistently difficult swallowing  New shortness of breath  Black, tarry-looking or red, bloody stools  FOLLOW UP:  If any biopsies were taken you will be contacted by phone or by letter within the next 1-3 weeks. Call [phone]  if you have not heard about the biopsies in 3 weeks.  Please also call with any specific questions about appointments or follow up tests.  

## 2019-12-17 NOTE — Anesthesia Preprocedure Evaluation (Signed)
Anesthesia Evaluation  Patient identified by MRN, date of birth, ID band Patient awake    Reviewed: Allergy & Precautions, NPO status , Patient's Chart, lab work & pertinent test results, reviewed documented beta blocker date and time   Airway Mallampati: III  TM Distance: >3 FB     Dental  (+) Chipped   Pulmonary asthma , sleep apnea ,    breath sounds clear to auscultation       Cardiovascular  Rhythm:Regular Rate:Normal     Neuro/Psych  Headaches,    GI/Hepatic hiatal hernia, GERD  ,  Endo/Other    Renal/GU      Musculoskeletal  (+) Arthritis ,   Abdominal   Peds  Hematology   Anesthesia Other Findings IBS. RLS.  Reproductive/Obstetrics                             Anesthesia Physical  Anesthesia Plan  ASA: III  Anesthesia Plan: MAC   Post-op Pain Management:    Induction: Intravenous  PONV Risk Score and Plan: 2 and Ondansetron, Propofol infusion and Treatment may vary due to age or medical condition  Airway Management Planned: Natural Airway and Nasal Cannula  Additional Equipment:   Intra-op Plan:   Post-operative Plan:   Informed Consent: I have reviewed the patients History and Physical, chart, labs and discussed the procedure including the risks, benefits and alternatives for the proposed anesthesia with the patient or authorized representative who has indicated his/her understanding and acceptance.       Plan Discussed with: CRNA  Anesthesia Plan Comments:         Anesthesia Quick Evaluation

## 2019-12-17 NOTE — Op Note (Signed)
[facility] Patient Name: Diana Ray Procedure Date: 12/17/2019 MRN: [id number] Attending MD: Justice Britain , MD Date of Birth: Sep 28, 1951 CSN: [id number] Age: 69 Admit Type: Outpatient Procedure:                Upper EUS Indications:              Elevated liver enzymes, Suspected                            choledocholithiasis, Acute pancreatitis Providers:                Justice Britain, MD, Burtis Junes, RN, Elspeth Cho Tech., Technician, Marla Roe, CRNA Referring MD:             Lin Landsman MD, MD, Ocie Cornfield. Ouida Sills MD,                            MD Medicines:                Monitored Anesthesia Care Complications:            No immediate complications. Estimated Blood Loss:     Estimated blood loss was minimal. Procedure:                Pre-Anesthesia Assessment:                           - Prior to the procedure, a History and Physical                            was performed, and patient medications and                            allergies were reviewed. The patient's tolerance of                            previous anesthesia was also reviewed. The risks                            and benefits of the procedure and the sedation                            options and risks were discussed with the patient.                            All questions were answered, and informed consent                            was obtained. Prior Anticoagulants: The patient has                            taken no previous anticoagulant or antiplatelet  agents except for NSAID medication. ASA Grade                            Assessment: III - A patient with severe systemic                            disease. After reviewing the risks and benefits,                            the patient was deemed in satisfactory condition to                            undergo the procedure.  After obtaining informed consent, the endoscope was                            passed under direct vision. Throughout the                            procedure, the patient's blood pressure, pulse, and                            oxygen saturations were monitored continuously. The                            [id number]-H190 ([id number]) Olympus gastroscope was                            introduced through the mouth, and advanced to the                            second part of duodenum. A wire was required to                            allow passage safely of the duodenoscope and the                            Linear EUS. The [id number]-Q190V ([id number]) Olympus                            duodenoscope was introduced through the mouth, and                            advanced to the second part of duodenum. The                            [id number]-[id number] ([id number]) Olympus Linear EUS was                            introduced through the mouth, and advanced to the                            duodenum for ultrasound examination from the  stomach and duodenum. The upper EUS was                            accomplished without difficulty. The patient                            tolerated the procedure. Scope In: Scope Out: Findings:      ENDOSCOPIC FINDING: :      A non-bleeding Zenker's diverticulum with a medium-sized opening, no       impacted food and no stigmata of recent bleeding was found at the UES.      No other gross mucosal lesions were noted in the entire esophagus.      The distal esophagus was significantly tortuous.      A low-grade of narrowing Schatzki ring was found at the gastroesophageal       junction.      A large hiatal hernia was found. The proximal extent of the gastric       folds (end of tubular esophagus) was 34 cm from the incisors. The hiatal       narrowing was 38 cm from the incisors. The Z-line was 33.5 cm from the       incisors.      No gross lesions were noted in  the entire examined stomach.      No gross mucosal lesions were noted in the duodenal bulb, in the first       portion of the duodenum and in the second portion of the duodenum.       Duodenal diverticula were noted on the lateral wall of the duodenum       proximal to ampulla.      Wire was placed after duodenoscope did not initially traverse the UES       due to Zenker's. After passage of the duodenoscope, a non-bleeding       superficial mucosal wrent as a result of passing the duodenoscope       through the Schatzki ring was noted at the gastroesophageal junction.      The duodenoscope was able to visualize the ampulla which was normal but       hiding under a hood/fold.      ENDOSONOGRAPHIC FINDING (Using wire guidance to allow passage of the       Linear EUS) :      Endosonographic imaging of the ampulla showed no intramural       (subepithelial) lesion.      There was no sign of significant endosonographic abnormality in the       common bile duct (2.0 -> 6.5 mm)and in the common hepatic duct (6.5 mm).       No cysts, no stones, no biliary sludge and ducts with regular contour       were identified.      Pancreatic parenchymal abnormalities were noted in the pancreatic head.       These consisted of lobularity without honeycombing.      Pancreatic parenchymal abnormalities were noted in the genu of the       pancreas, pancreatic body and pancreatic tail. These consisted of       hyperechoic strands.      The pancreatic duct had a normal endosonographic appearance in the       pancreatic head (1.3 mm -> 1.6 mm), genu of the pancreas (1.0  mm), body       of the pancreas (0.4 mm) and tail of the pancreas (0.4 mm).      One enlarged lymph node was visualized in the porta hepatis region. It       measured 6 mm by 4 mm in maximal cross-sectional diameter. The node was       triangular, isoechoic and had well defined margins.      Endosonographic imaging in the visualized portion of the  liver showed no       mass.      The celiac region was visualized. Impression:               EGD Impression:                           - Zenker's diverticulum noted at UES..                           - No gross mucosal lesions in esophagus.                           - Tortuous esophagus distally.                           - Low-grade of narrowing Schatzki ring at [facility].                           - Large hiatal hernia.                           - No gross mucosal lesions in the stomach.                           - No gross lesions in the duodenal bulb, in the                            first portion of the duodenum and in the second                            portion of the duodenum.                           - Incidental superficial wrent of the Schatzki ring                            after duodenoscope passage.                           - Normal ampulla hidden under hood.                           EUS Impression:                           - There was no sign of  significant pathology in the                            common bile duct and in the common hepatic duct.                           - Pancreatic parenchymal abnormalities consisting                            of lobularity were noted in the pancreatic head.                            Pancreatic parenchymal abnormalities consisting of                            hyperechoic strands were noted in the genu of the                            pancreas, pancreatic body and pancreatic tail.                            These findings can be noted at times in patients                            with recent pancreatitis. Although they can be                            suggestive of chronic pancreatitis, they do not fit                            Rosemont criteria for true diagnosis/classifcation.                           - The pancreatic duct had a normal endosonographic                            appearance  in the pancreatic head, genu of the                            pancreas, body of the pancreas and tail of the                            pancreas.                           - No abnormality at the Ampulla.                           - One enlarged lymph node was visualized in the                            porta hepatis region. Tissue has not been obtained.  However, the endosonographic appearance is                            consistent with benign inflammatory changes.                           - No abnormalities noted in visualized liver. Moderate Sedation:      Not Applicable - Patient had care per Anesthesia. Recommendation:           - The patient will be observed post-procedure,                            until all discharge criteria are met.                           - Discharge patient to home.                           - Patient has a contact number available for                            emergencies. The signs and symptoms of potential                            delayed complications were discussed with the                            patient. Return to normal activities tomorrow.                            Written discharge instructions were provided to the                            patient.                           - Low fat diet.                           - Observe patient's clinical course.                           - Repeat LFTs with [facility] and monitor for                            potential recurrence of Pancreatitis.                           - If LFTs remain elevated, hepatocellular workup is                            likely reasonable for further evaluation.                           - If patient has recurrent pancreatitis of no other  clear etiology - query Functional Disorder of the                            Sphincter Type II, if the patient has elevations in                            Lipase and LFTs. May  benefit from quaternary care                            discussion for potential Manometry evaluation.                           - Her changes of the pancreatic parenchyma, which                            were not noted throughout but were present are not                            completely consistent with chronic pancreatitis and                            they can be suggestive, but they do not fit                            Rosemont criteria for a true diagnosis. Her recent                            pancreatitis episode could also allow changes in                            the EUS imaging since this was less than 3-weeks                            since her pancreatitis episode.                           - The findings and recommendations were discussed                            with the patient.                           - The findings and recommendations were discussed                            with the patient's family. Procedure Code(s):        --- Professional ---                           [phone], Esophagogastroduodenoscopy, flexible,                            transoral; with endoscopic ultrasound examination  limited to the esophagus, stomach or duodenum, and                            adjacent structures Diagnosis Code(s):        --- Professional ---                           K22.5, Diverticulum of esophagus, acquired                           Q39.9, Congenital malformation of esophagus,                            unspecified                           K22.2, Esophageal obstruction                           K44.9, Diaphragmatic hernia without obstruction or                            gangrene                           S27.818A, Other injury of esophagus (thoracic                            part), initial encounter                           K86.9, Disease of pancreas, unspecified                           R59.0, Localized enlarged lymph nodes                            R74.8, Abnormal levels of other serum enzymes                           K85.90, Acute pancreatitis without necrosis or                            infection, unspecified CPT copyright 2019 [employer]. All rights reserved. The codes documented in this report are preliminary and upon coder review may  be revised to meet current compliance requirements. Justice Britain, MD 12/17/2019 12:43:53 PM Number of Addenda: 0

## 2019-12-18 ENCOUNTER — Encounter: Payer: Self-pay | Admitting: *Deleted

## 2019-12-22 ENCOUNTER — Other Ambulatory Visit: Payer: Self-pay

## 2019-12-24 ENCOUNTER — Other Ambulatory Visit: Payer: Self-pay

## 2019-12-24 ENCOUNTER — Ambulatory Visit (INDEPENDENT_AMBULATORY_CARE_PROVIDER_SITE_OTHER): Payer: Medicare PPO | Admitting: Gastroenterology

## 2019-12-24 ENCOUNTER — Encounter: Payer: Self-pay | Admitting: Gastroenterology

## 2019-12-24 VITALS — BP 118/77 | HR 85 | Temp 97.5°F | Ht 62.5 in | Wt 197.2 lb

## 2019-12-24 DIAGNOSIS — R5381 Other malaise: Secondary | ICD-10-CM | POA: Diagnosis not present

## 2019-12-24 DIAGNOSIS — R5383 Other fatigue: Secondary | ICD-10-CM

## 2019-12-24 DIAGNOSIS — R7989 Other specified abnormal findings of blood chemistry: Secondary | ICD-10-CM

## 2019-12-24 DIAGNOSIS — Z8719 Personal history of other diseases of the digestive system: Secondary | ICD-10-CM | POA: Diagnosis not present

## 2019-12-24 DIAGNOSIS — M47816 Spondylosis without myelopathy or radiculopathy, lumbar region: Secondary | ICD-10-CM | POA: Insufficient documentation

## 2019-12-24 NOTE — Progress Notes (Signed)
Cephas Darby, MD [address]  [place]  [place], [place] [address]  Main: [phone]  Fax: [phone]    Gastroenterology Consultation  Referring Provider:     Kirk Ruths, MD Primary Care Physician:  Kirk Ruths, MD Primary Gastroenterologist:  Dr. Cephas Darby Reason for Consultation: Follow up of acute pancreatitis and elevated LFTs        HPI:   Diana Ray is a 69 y.o. female referred by Dr. Ouida Sills, Ocie Cornfield, MD  for consultation & management of follow-up of acute pancreatitis and elevated LFTs.  Patient has history of restless leg syndrome, who was admitted to [facility] on 11/24/2019 secondary to acute epigastric and left upper quadrant pain, associated with nausea, fever 101.  She was diagnosed with acute pancreatitis, elevated lipase 2624, AST 198, ALT 391, alkaline phosphatase 290, total bilirubin 5.1, direct bilirubin 3.3, normal lactic acid.  History of prior cholecystectomy, patient underwent CT scan with no evidence of biliary obstruction, normal-appearing pancreas. Right upper quadrant ultrasound revealed mild fatty liver, no biliary dilatation.  She also underwent MRCP which was unremarkable, normal-appearing pancreas.  Few scattered hepatic cysts with no worrisome hepatic lesions.  Fatty liver.  No biliary dilation.  To moderate size duodenal diverticula.  Interval summary After discharge, patient underwent upper endoscopic ultrasound by Dr. Rush Landmark which was unremarkable.  Her LFTs were rechecked yesterday which revealed normal transaminases, mildly elevated alkaline phosphatase, normal bilirubin. She reports that her epigastric pain is resolved, reports mild left upper quadrant discomfort.  She has stopped drinking sodas, alcohol since the attack of acute pancreatitis.  She reports generalized malaise, lack of interest since hospital discharge.  Admits to drinking a glass of wine or a cocktail about 3 times a week, stopped about a  month ago She does drink 2 cans of [employer] daily, stopped about a month ago  NSAIDs: Excedrin, stopped since last hospitalization  Antiplts/Anticoagulants/Anti thrombotics: None  GI Procedures: EGD and colonoscopy by Dr. Gustavo Lah Several subcentimeter tubular adenomas in the colon, mildly obstructing Schatzki's ring that was dilated to 46 [place]  Past Medical History:  Diagnosis Date  . Allergy   . Arthritis    both knees, both hands and lumbar spine.  . Asthma   . GERD (gastroesophageal reflux disease)   . Headache, acute   . Hernia, hiatal   . IBS (irritable bowel syndrome)   . Myalgia   . Osteoarthritis of both knees   . Osteoporosis   . RLS (restless legs syndrome)   . Sleep apnea   . Zenker's diverticulum     Past Surgical History:  Procedure Laterality Date  . ABDOMINAL HYSTERECTOMY  2006  . CHOLECYSTECTOMY  2006  . COLONOSCOPY  2015  . COLONOSCOPY WITH PROPOFOL N/A 07/22/2019   Procedure: COLONOSCOPY WITH PROPOFOL;  Surgeon: Lollie Sails, MD;  Location: [facility] ENDOSCOPY;  Service: Endoscopy;  Laterality: N/A;  . ESOPHAGOGASTRODUODENOSCOPY    . ESOPHAGOGASTRODUODENOSCOPY (EGD) WITH PROPOFOL N/A 01/09/2018   Procedure: ESOPHAGOGASTRODUODENOSCOPY (EGD) WITH PROPOFOL;  Surgeon: Toledo, Benay Pike, MD;  Location: [facility] ENDOSCOPY;  Service: Gastroenterology;  Laterality: N/A;  . ESOPHAGOGASTRODUODENOSCOPY (EGD) WITH PROPOFOL N/A 12/17/2019   Procedure: ESOPHAGOGASTRODUODENOSCOPY (EGD) WITH PROPOFOL;  Surgeon: Rush Landmark Telford Nab., MD;  Location: [facility] ENDOSCOPY;  Service: Gastroenterology;  Laterality: N/A;  . EUS N/A 12/17/2019   Procedure: UPPER ENDOSCOPIC ULTRASOUND (EUS) RADIAL;  Surgeon: Rush Landmark Telford Nab., MD;  Location: [facility] ENDOSCOPY;  Service: Gastroenterology;  Laterality: N/A;  . KYPHOPLASTY N/A  05/30/2018   Procedure: Hewitt Shorts;  Surgeon: Hessie Knows, MD;  Location: [facility] ORS;  Service: Orthopedics;  Laterality: N/A;  . KYPHOPLASTY    . LIPOMA  EXCISION  05/2017   back of neck  . MENISCUS REPAIR Bilateral [id number]    Current Outpatient Medications:  .  acetaminophen (TYLENOL) 500 MG tablet, Take 1,000 mg by mouth every 6 (six) hours as needed for moderate pain. , Disp: , Rfl:  .  albuterol (PROVENTIL HFA;VENTOLIN HFA) 108 (90 Base) MCG/ACT inhaler, Inhale 2 puffs into the lungs every 6 (six) hours as needed for wheezing or shortness of breath., Disp: , Rfl:  .  Aspirin-Acetaminophen-Caffeine (EXCEDRIN PO), Take 1 tablet by mouth daily as needed (pain). , Disp: , Rfl:  .  carbidopa-levodopa (SINEMET IR) 25-100 MG tablet, Take 1-2 tablets by mouth at bedtime. , Disp: , Rfl: 3 .  cetirizine (ZYRTEC) 10 MG tablet, Take 10 mg by mouth daily as needed for allergies. , Disp: , Rfl:  .  cyclobenzaprine (FLEXERIL) 10 MG tablet, Take 10 mg by mouth 3 (three) times daily as needed for muscle spasms., Disp: , Rfl:  .  DULoxetine (CYMBALTA) 30 MG capsule, Take 30 mg by mouth daily., Disp: , Rfl: 12 .  Fluticasone-Salmeterol (ADVAIR DISKUS) 250-50 MCG/DOSE AEPB, Inhale 1 puff into the lungs in the morning and at bedtime. , Disp: , Rfl:  .  Multiple Vitamin (MULTIVITAMIN) tablet, Take 1 tablet by mouth daily., Disp: , Rfl:  .  omeprazole (PRILOSEC) 40 MG capsule, Take 1 capsule (40 mg total) by mouth daily., Disp: 30 capsule, Rfl: 3 .  traMADol (ULTRAM) 50 MG tablet, Take by mouth., Disp: , Rfl:     Family History  Problem Relation Age of Onset  . Heart disease Mother   . Heart disease Father   . Diabetes Brother   . Breast cancer Neg Hx      Social History   Tobacco Use  . Smoking status: Never Smoker  . Smokeless tobacco: Never Used  Substance Use Topics  . Alcohol use: Yes    Alcohol/week: 9.0 standard drinks    Types: 4 Standard drinks or equivalent, 5 Glasses of wine per week  . Drug use: No    Allergies as of 12/24/2019  . (No Known Allergies)    Review of Systems:    All systems reviewed and negative except where noted  in HPI.   Physical Exam:  BP 118/77 (BP Location: Left Arm, Patient Position: Sitting, Cuff Size: Normal)   Pulse 85   Temp (!) 97.5 F (36.4 C) (Oral)   Ht 5' 2.5" (1.588 m)   Wt 197 lb 4 oz (89.5 kg)   BMI 35.50 kg/m  No LMP recorded. Patient has had a hysterectomy.  General:   Alert,  Well-developed, well-nourished, pleasant and cooperative in NAD Head:  Normocephalic and atraumatic. Eyes:  Sclera clear, no icterus.   Conjunctiva pink. Ears:  Normal auditory acuity. Nose:  No deformity, discharge, or lesions. Mouth:  No deformity or lesions,oropharynx pink & moist. Neck:  Supple; no masses or thyromegaly. Lungs:  Respirations even and unlabored.  Clear throughout to auscultation.   No wheezes, crackles, or rhonchi. No acute distress. Heart:  Regular rate and rhythm; no murmurs, clicks, rubs, or gallops. Abdomen:  Normal bowel sounds. Soft, non-tender and non-distended without masses, hepatosplenomegaly or hernias noted.  No guarding or rebound tenderness.   Rectal: Not performed Msk:  Symmetrical without gross deformities. Good, equal movement & strength bilaterally.  Pulses:  Normal pulses noted. Extremities:  No clubbing or edema.  No cyanosis. Neurologic:  Alert and oriented x3;  grossly normal neurologically. Skin:  Intact without significant lesions or rashes. No jaundice. Psych:  Alert and cooperative. Normal mood and affect.  Imaging Studies: Reviewed  Assessment and Plan:   Diana Ray is a 69 y.o. female with BMI 35, sleep apnea with recent attack of acute pancreatitis and elevated LFTs  Acute pancreatitis: Currently resolved Imaging and upper endoscopic ultrasound did not reveal any evidence of pancreatic lesion, there was no evidence of biliary obstruction Normal triglycerides and IgG4 levels Likely etiology alcohol use Recommend low-fat diet  Elevated LFTs Almost resolved, alkaline phosphatase is mildly elevated Check viral hepatitis panel Check  ANA, AMA, anti-smooth muscle antibodies, ferritin, ceruloplasmin levels  Generalized fatigue and malaise Check hemoglobin A1c, TSH, vitamin D levels, iron studies, B12 and folate panel Sleep apnea also can cause generalized fatigue and malaise Also, discussed with patient about possible depression, asked her to follow-up with Dr. Ouida Sills reported   Follow up in 2 months   Cephas Darby, MD

## 2019-12-26 LAB — ANA: Anti Nuclear Antibody (ANA): POSITIVE — AB

## 2019-12-26 LAB — IRON,TIBC AND FERRITIN PANEL
Ferritin: 49 ng/mL (ref 15–150)
Iron Saturation: 17 % (ref 15–55)
Iron: 53 ug/dL (ref 27–139)
Total Iron Binding Capacity: 304 ug/dL (ref 250–450)
UIBC: 251 ug/dL (ref 118–369)

## 2019-12-26 LAB — HEPATITIS PANEL, ACUTE
Hep A IgM: NEGATIVE
Hep B C IgM: NEGATIVE
Hep C Virus Ab: <0.1 {s_co_ratio} (ref 0.0–0.9)
Hepatitis B Surface Ag: NEGATIVE

## 2019-12-26 LAB — CERULOPLASMIN: Ceruloplasmin: 27.4 mg/dL (ref 19.0–39.0)

## 2019-12-26 LAB — B12 AND FOLATE PANEL
Folate: 8.1 ng/mL (ref >3.0)
Vitamin B-12: 758 pg/mL (ref 232–1245)

## 2019-12-26 LAB — TSH: TSH: 3.68 u[IU]/mL (ref 0.450–4.500)

## 2019-12-26 LAB — HEMOGLOBIN A1C
Est. average glucose Bld gHb Est-mCnc: 114 mg/dL
Hgb A1c MFr Bld: 5.6 % (ref 4.8–5.6)

## 2019-12-26 LAB — VITAMIN D 25 HYDROXY (VIT D DEFICIENCY, FRACTURES): Vit D, 25-Hydroxy: 24.7 ng/mL — ABNORMAL LOW (ref 30.0–100.0)

## 2019-12-26 LAB — MITOCHONDRIAL/SMOOTH MUSCLE AB PNL
Mitochondrial Ab: <20 U (ref 0.0–20.0)
Smooth Muscle Ab: 5 U (ref 0–19)

## 2020-02-18 IMAGING — MR MR BRAIN/IAC WO/W CM
10 of 14 series · 28 of 48 positions shown · IV contrast (gadavist)
Comparison: None.

CLINICAL DATA: Hearing loss for 2-3 years, left worse than right

EXAM:
MRI HEAD WITHOUT AND WITH CONTRAST
TECHNIQUE: Multiplanar, multiecho pulse sequences of the brain and surrounding
structures were obtained without and with intravenous contrast.
CONTRAST:  8mL GADAVIST GADOBUTROL 1 MMOL/ML IV SOLN

[Series 5: T1 · sagittal · 5.0mm · 0.62mm/px · 2 of 25 slices shown (1 of 3)]
[im 1/25]
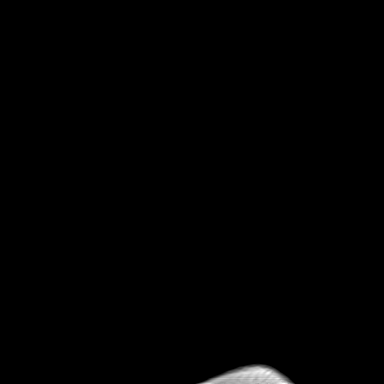
[im 25/25]
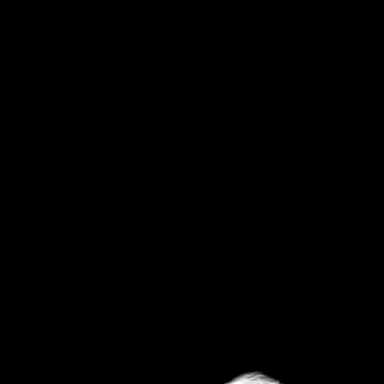

[Series 6: ax dwi_tracew · axial · 3.0mm · 0.60mm/px · z∈[-112,+48]mm · 4 of 50 slices shown]
[im 1/50]
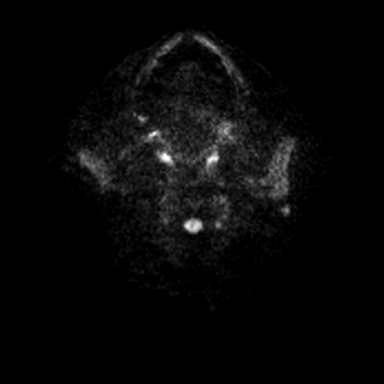
[im 17/50]
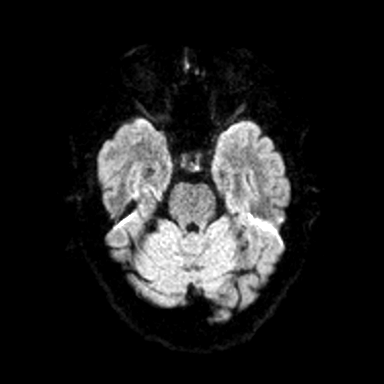
[im 33/50]
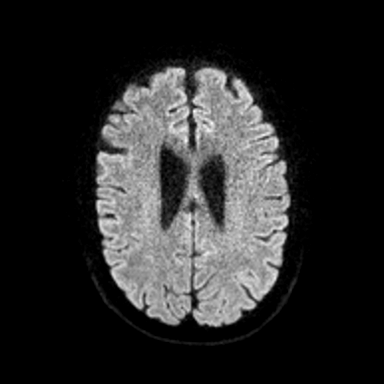
[im 50/50]
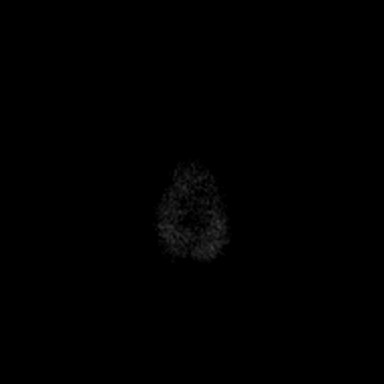

[Series 7: ax dwi_adc · axial · 3.0mm · 0.60mm/px · z∈[-112,-7]mm · 3 of 50 slices shown]
[im 1/50]
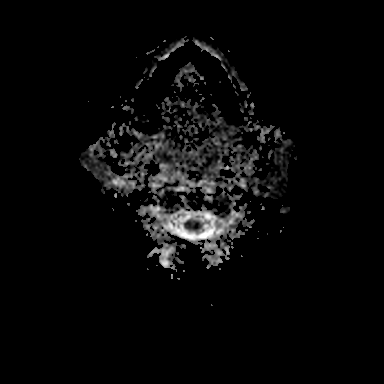
[im 17/50]
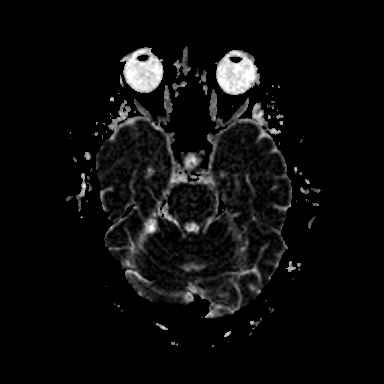
[im 33/50]
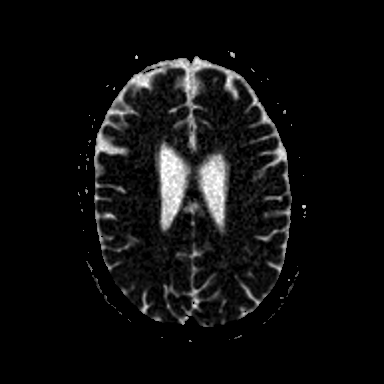

[Series 8: T2 · axial · 5.0mm · 0.53mm/px · z∈[-112,+43]mm · 2 of 27 slices shown]
[im 1/27]
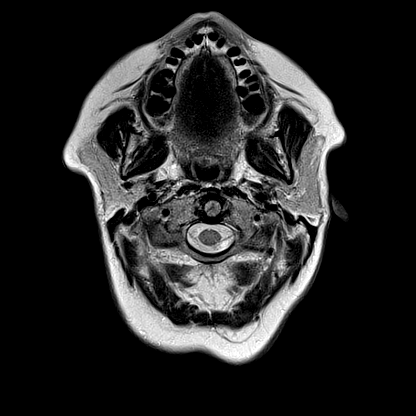
[im 27/27]
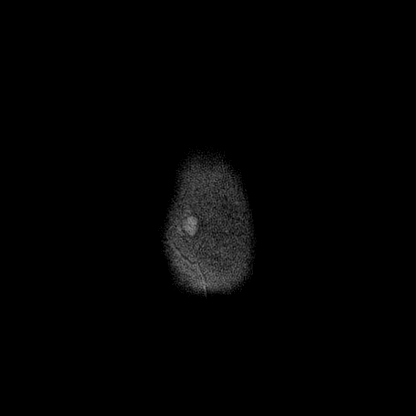

[Series 13: FLAIR · axial · 3.0mm · 0.53mm/px · z∈[-115,+46]mm · 4 of 55 slices shown]
[im 1/55]
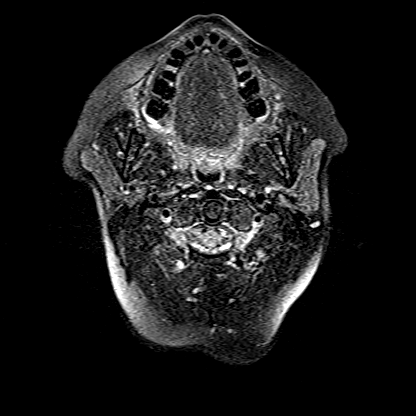
[im 19/55]
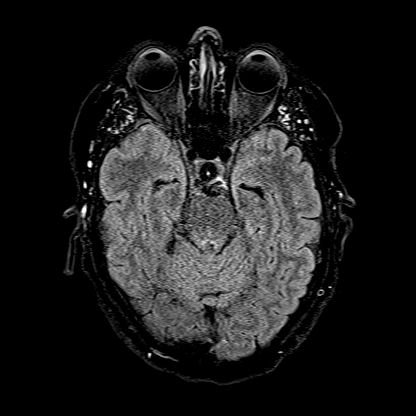
[im 37/55]
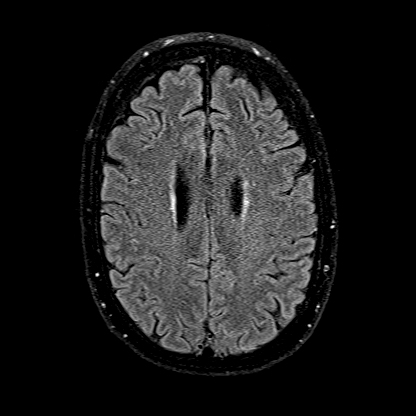
[im 55/55]
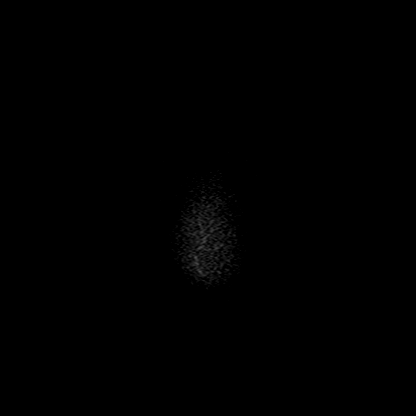

[Series 14: T1 · coronal · non-contrast · 3.0mm · 0.21mm/px · 1 of 13 slices shown (2 of 3)]
[im 1/13]
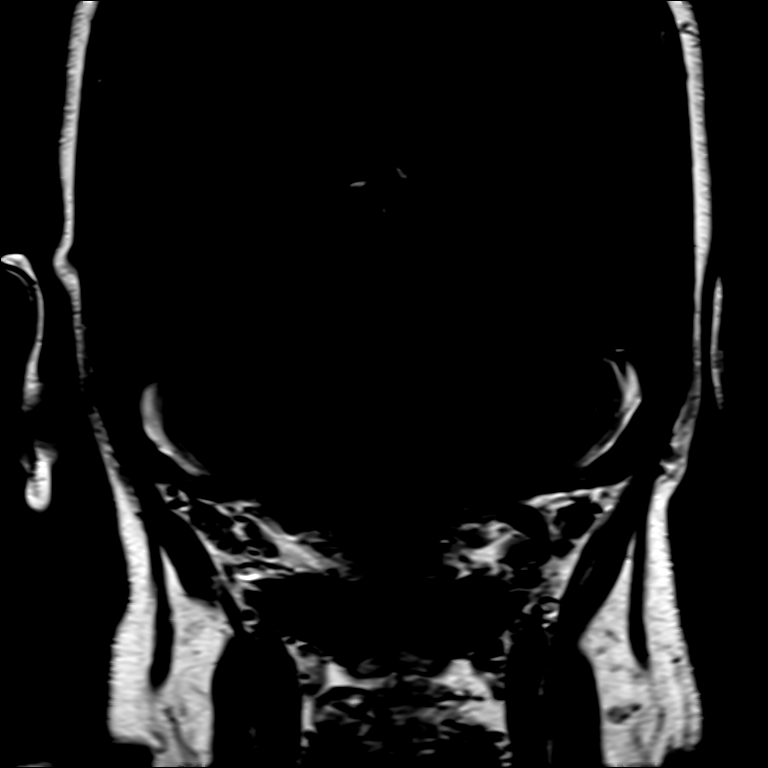

[Series 16: T1 · axial · non-contrast · 3.0mm · 0.21mm/px · 1 of 15 slices shown (3 of 3)]
[im 1/15]
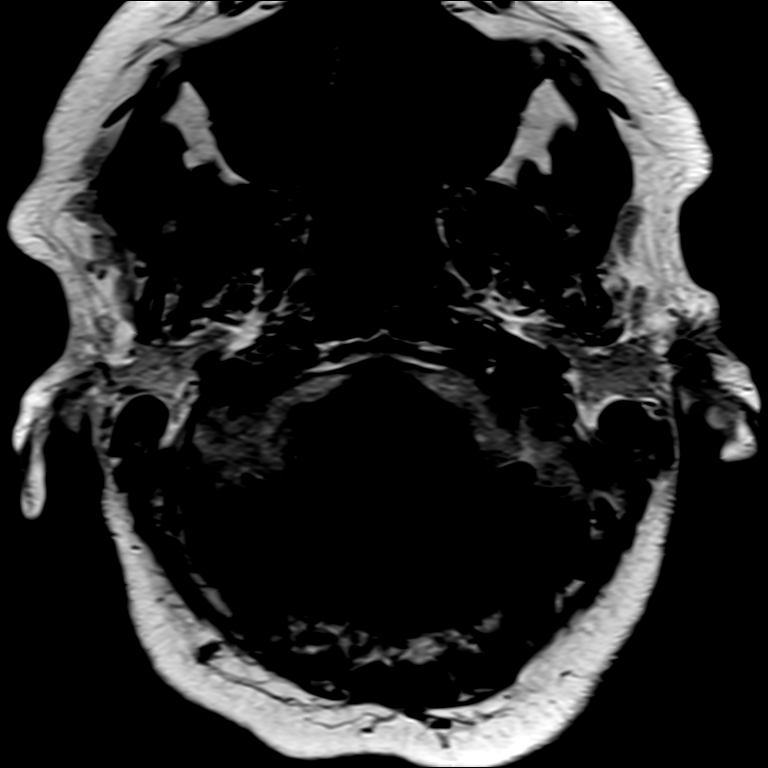

[Series 17: T1 post-contrast · axial · 3.0mm · 0.21mm/px · 1 of 15 slices shown (1 of 3)]
[im 1/15]
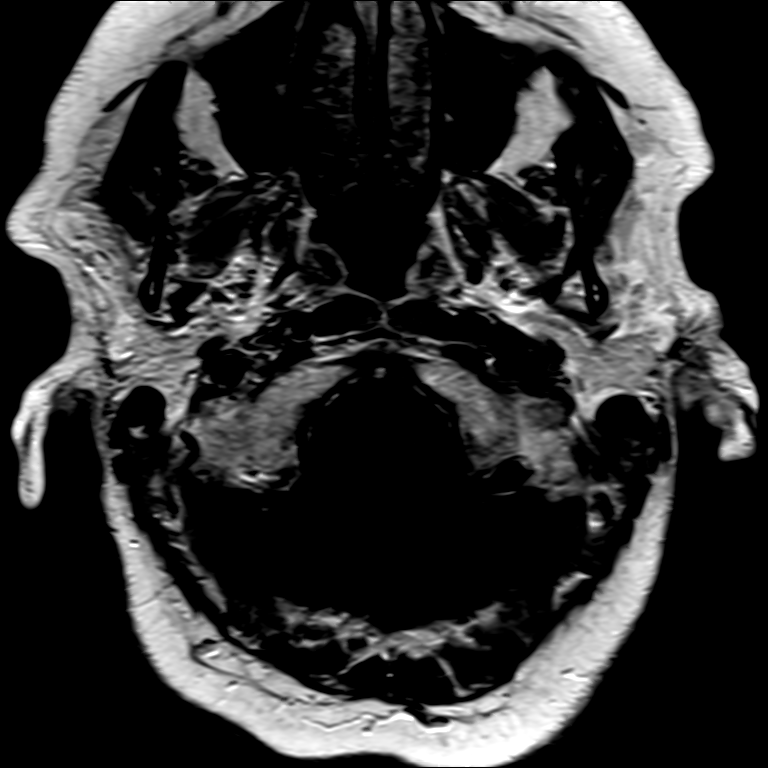

[Series 18: T1 post-contrast · coronal · 3.0mm · 0.21mm/px · 1 of 13 slices shown (2 of 3)]
[im 1/13]
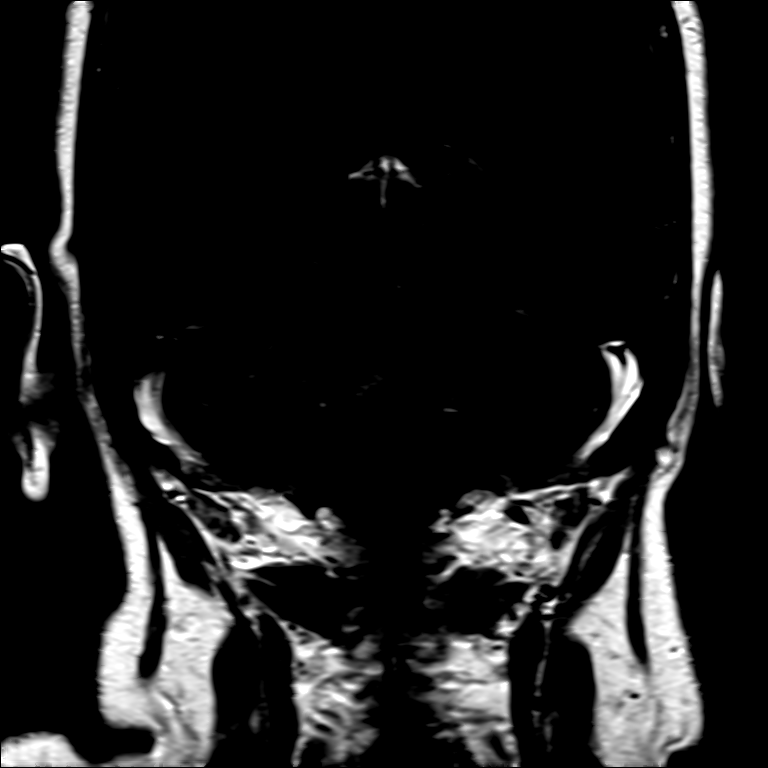

[Series 19: T1 post-contrast · axial · 1.0mm · 0.98mm/px · z∈[-119,+55]mm · 9 of 176 slices shown (3 of 3)]
[im 1/176]
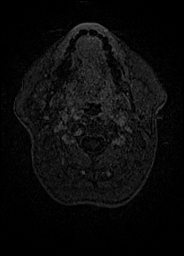
[im 32/176]
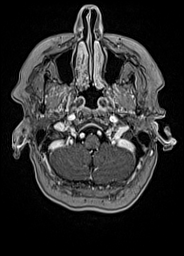
[im 48/176]
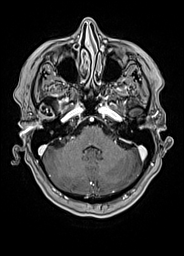
[im 80/176]
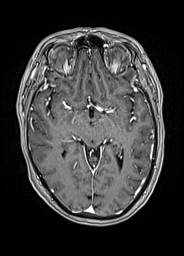
[im 96/176]
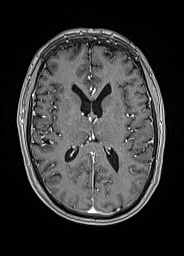
[im 128/176]
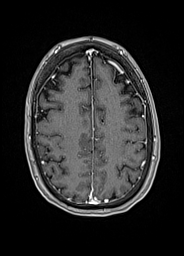
[im 144/176]
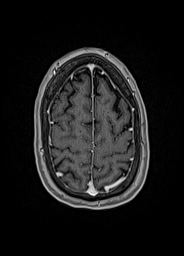
[im 160/176]
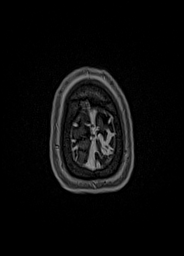
[im 176/176]
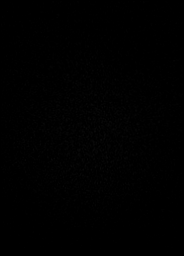

[28 of 48 positions shown; findings below may reference images not displayed]

FINDINGS: Brain: Symmetric normal labyrinthine signal. No new radiographs like
enhancement. Normal appearance of the brainstem and cisterns.

Minimal FLAIR hyperintensity in the cerebral white matter. Normal
brain volume. No infarct, hemorrhage, hydrocephalus, collection

Vascular: Normal flow voids and vascular enhancements

Skull and upper cervical spine: Normal marrow signal

Sinuses/Orbits: Negative
IMPRESSION: Negative exam.  No explanation for hearing loss.

## 2020-02-25 ENCOUNTER — Ambulatory Visit: Payer: Medicare PPO | Admitting: Gastroenterology

## 2020-02-26 ENCOUNTER — Ambulatory Visit (INDEPENDENT_AMBULATORY_CARE_PROVIDER_SITE_OTHER): Payer: Medicare PPO | Admitting: Gastroenterology

## 2020-02-26 ENCOUNTER — Other Ambulatory Visit: Payer: Self-pay

## 2020-02-26 ENCOUNTER — Encounter: Payer: Self-pay | Admitting: Gastroenterology

## 2020-02-26 VITALS — BP 114/73 | HR 89 | Temp 97.3°F | Ht 63.0 in | Wt 189.2 lb

## 2020-02-26 DIAGNOSIS — R7989 Other specified abnormal findings of blood chemistry: Secondary | ICD-10-CM

## 2020-02-26 DIAGNOSIS — K5909 Other constipation: Secondary | ICD-10-CM

## 2020-02-26 NOTE — Progress Notes (Signed)
Cephas Darby, MD [address]  [place]  [place], [place] [address]  Main: [phone]  Fax: [phone]    Gastroenterology Consultation  Referring Provider:     Kirk Ruths, MD Primary Care Physician:  Kirk Ruths, MD Primary Gastroenterologist:  Dr. Cephas Darby Reason for Consultation: Follow up of acute pancreatitis and elevated LFTs        HPI:   Diana Ray is a 69 y.o. female referred by Dr. Ouida Sills, Ocie Cornfield, MD  for consultation & management of follow-up of acute pancreatitis and elevated LFTs.  Patient has history of restless leg syndrome, who was admitted to [facility] on 11/24/2019 secondary to acute epigastric and left upper quadrant pain, associated with nausea, fever 101.  She was diagnosed with acute pancreatitis, elevated lipase 2624, AST 198, ALT 391, alkaline phosphatase 290, total bilirubin 5.1, direct bilirubin 3.3, normal lactic acid.  History of prior cholecystectomy, patient underwent CT scan with no evidence of biliary obstruction, normal-appearing pancreas. Right upper quadrant ultrasound revealed mild fatty liver, no biliary dilatation.  She also underwent MRCP which was unremarkable, normal-appearing pancreas.  Few scattered hepatic cysts with no worrisome hepatic lesions.  Fatty liver.  No biliary dilation.  To moderate size duodenal diverticula.  Interval summary After discharge, patient underwent upper endoscopic ultrasound by Dr. Rush Landmark which was unremarkable.  Her LFTs were rechecked yesterday which revealed normal transaminases, mildly elevated alkaline phosphatase, normal bilirubin. She reports that her epigastric pain is resolved, reports mild left upper quadrant discomfort.  She has stopped drinking sodas, alcohol since the attack of acute pancreatitis.  She reports generalized malaise, lack of interest since hospital discharge.  Admits to drinking a glass of wine or a cocktail about 3 times a week, stopped about a  month ago She does drink 2 cans of Mountain Dew daily, stopped about a month ago  Follow-up visit 02/27/2020 Patient reports doing well overall.  However, she has constipation.  She reports cutting back significantly on sodas.  Patient reports she is undergoing repair of Zenker's diverticulum  NSAIDs: Excedrin, stopped since last hospitalization  Antiplts/Anticoagulants/Anti thrombotics: None  GI Procedures: EGD and colonoscopy by Dr. Gustavo Lah Several subcentimeter tubular adenomas in the colon, mildly obstructing Schatzki's ring that was dilated to 26 [place]  Past Medical History:  Diagnosis Date  . Allergy   . Arthritis    both knees, both hands and lumbar spine.  . Asthma   . GERD (gastroesophageal reflux disease)   . Headache, acute   . Hernia, hiatal   . IBS (irritable bowel syndrome)   . Myalgia   . Osteoarthritis of both knees   . Osteoporosis   . RLS (restless legs syndrome)   . Sleep apnea   . Zenker's diverticulum     Past Surgical History:  Procedure Laterality Date  . ABDOMINAL HYSTERECTOMY  2006  . CHOLECYSTECTOMY  2006  . COLONOSCOPY  2015  . COLONOSCOPY WITH PROPOFOL N/A 07/22/2019   Procedure: COLONOSCOPY WITH PROPOFOL;  Surgeon: Lollie Sails, MD;  Location: [facility] ENDOSCOPY;  Service: Endoscopy;  Laterality: N/A;  . ESOPHAGOGASTRODUODENOSCOPY    . ESOPHAGOGASTRODUODENOSCOPY (EGD) WITH PROPOFOL N/A 01/09/2018   Procedure: ESOPHAGOGASTRODUODENOSCOPY (EGD) WITH PROPOFOL;  Surgeon: Toledo, Benay Pike, MD;  Location: [facility] ENDOSCOPY;  Service: Gastroenterology;  Laterality: N/A;  . ESOPHAGOGASTRODUODENOSCOPY (EGD) WITH PROPOFOL N/A 12/17/2019   Procedure: ESOPHAGOGASTRODUODENOSCOPY (EGD) WITH PROPOFOL;  Surgeon: Rush Landmark Telford Nab., MD;  Location: [facility] ENDOSCOPY;  Service: Gastroenterology;  Laterality: N/A;  .  EUS N/A 12/17/2019   Procedure: UPPER ENDOSCOPIC ULTRASOUND (EUS) RADIAL;  Surgeon: Irving Copas., MD;  Location: [facility] ENDOSCOPY;  Service:  Gastroenterology;  Laterality: N/A;  . KYPHOPLASTY N/A 05/30/2018   Procedure: Hewitt Shorts;  Surgeon: Hessie Knows, MD;  Location: [facility] ORS;  Service: Orthopedics;  Laterality: N/A;  . KYPHOPLASTY    . LIPOMA EXCISION  05/2017   back of neck  . MENISCUS REPAIR Bilateral [id number]    Current Outpatient Medications:  .  acetaminophen (TYLENOL) 500 MG tablet, Take 1,000 mg by mouth every 6 (six) hours as needed for moderate pain. , Disp: , Rfl:  .  albuterol (PROVENTIL HFA;VENTOLIN HFA) 108 (90 Base) MCG/ACT inhaler, Inhale 2 puffs into the lungs every 6 (six) hours as needed for wheezing or shortness of breath., Disp: , Rfl:  .  Aspirin-Acetaminophen-Caffeine (EXCEDRIN PO), Take 1 tablet by mouth daily as needed (pain). , Disp: , Rfl:  .  calcium carbonate (TUMS EX) 750 MG chewable tablet, Chew by mouth., Disp: , Rfl:  .  carbidopa-levodopa (SINEMET IR) 25-100 MG tablet, Take 1-2 tablets by mouth at bedtime. , Disp: , Rfl: 3 .  cetirizine (ZYRTEC) 10 MG tablet, Take 10 mg by mouth daily as needed for allergies. , Disp: , Rfl:  .  Cholecalciferol 50 MCG (2000 UT) CAPS, Take by mouth., Disp: , Rfl:  .  cyanocobalamin 1000 MCG tablet, Take by mouth., Disp: , Rfl:  .  cyclobenzaprine (FLEXERIL) 10 MG tablet, Take 10 mg by mouth 3 (three) times daily as needed for muscle spasms., Disp: , Rfl:  .  DULoxetine (CYMBALTA) 30 MG capsule, Take 30 mg by mouth daily., Disp: , Rfl: 12 .  Fluticasone-Salmeterol (ADVAIR DISKUS) 250-50 MCG/DOSE AEPB, Inhale 1 puff into the lungs in the morning and at bedtime. , Disp: , Rfl:  .  Multiple Vitamin (MULTIVITAMIN) tablet, Take 1 tablet by mouth daily., Disp: , Rfl:  .  omeprazole (PRILOSEC) 40 MG capsule, Take 1 capsule (40 mg total) by mouth daily., Disp: 30 capsule, Rfl: 3 .  traMADol (ULTRAM) 50 MG tablet, Take by mouth., Disp: , Rfl:     Family History  Problem Relation Age of Onset  . Heart disease Mother   . Heart disease Father   . Diabetes Brother     . Breast cancer Neg Hx      Social History   Tobacco Use  . Smoking status: Never Smoker  . Smokeless tobacco: Never Used  Substance Use Topics  . Alcohol use: Yes    Alcohol/week: 9.0 standard drinks    Types: 4 Standard drinks or equivalent, 5 Glasses of wine per week  . Drug use: No    Allergies as of 02/26/2020  . (No Known Allergies)    Review of Systems:    All systems reviewed and negative except where noted in HPI.   Physical Exam:  BP 114/73 (BP Location: Left Arm, Patient Position: Sitting, Cuff Size: Normal)   Pulse 89   Temp (!) 97.3 F (36.3 C) (Oral)   Ht 5\' 3"  (1.6 m)   Wt 189 lb 4 oz (85.8 kg)   BMI 33.52 kg/m  No LMP recorded. Patient has had a hysterectomy.  General:   Alert,  Well-developed, well-nourished, pleasant and cooperative in NAD Head:  Normocephalic and atraumatic. Eyes:  Sclera clear, no icterus.   Conjunctiva pink. Ears:  Normal auditory acuity. Nose:  No deformity, discharge, or lesions. Mouth:  No deformity or lesions,oropharynx pink & moist. Neck:  Supple; no masses or thyromegaly. Lungs:  Respirations even and unlabored.  Clear throughout to auscultation.   No wheezes, crackles, or rhonchi. No acute distress. Heart:  Regular rate and rhythm; no murmurs, clicks, rubs, or gallops. Abdomen:  Normal bowel sounds. Soft, non-tender and non-distended without masses, hepatosplenomegaly or hernias noted.  No guarding or rebound tenderness.   Rectal: Not performed Msk:  Symmetrical without gross deformities. Good, equal movement & strength bilaterally. Pulses:  Normal pulses noted. Extremities:  No clubbing or edema.  No cyanosis. Neurologic:  Alert and oriented x3;  grossly normal neurologically. Skin:  Intact without significant lesions or rashes. No jaundice. Psych:  Alert and cooperative. Normal mood and affect.  Imaging Studies: Reviewed  Assessment and Plan:   Diana Ray is a 69 y.o. female with BMI 35, sleep apnea  with recent attack of acute pancreatitis and elevated LFTs  Acute pancreatitis: Currently resolved Imaging and upper endoscopic ultrasound did not reveal any evidence of pancreatic lesion, there was no evidence of biliary obstruction Normal triglycerides and IgG4 levels Likely etiology alcohol use Recommend low-fat diet  Elevated LFTs, secondary to fatty liver Almost resolved, alkaline phosphatase is mildly elevated viral hepatitis panel negative ANA was mildly elevated, AMA, anti-smooth muscle antibodies, ferritin, ceruloplasmin levels were normal  Generalized fatigue and malaise hemoglobin A1c, TSH, vitamin D levels, iron studies, B12 and folate panel were normal Sleep apnea also can cause generalized fatigue and malaise   Follow up as needed   Cephas Darby, MD

## 2020-02-27 DIAGNOSIS — M791 Myalgia, unspecified site: Secondary | ICD-10-CM | POA: Insufficient documentation

## 2020-12-23 ENCOUNTER — Other Ambulatory Visit: Payer: Self-pay | Admitting: Obstetrics & Gynecology

## 2020-12-23 DIAGNOSIS — Z1231 Encounter for screening mammogram for malignant neoplasm of breast: Secondary | ICD-10-CM

## 2020-12-24 ENCOUNTER — Ambulatory Visit
Admission: RE | Admit: 2020-12-24 | Discharge: 2020-12-24 | Disposition: A | Discharge status: Home / Self Care | Payer: Medicare PPO | Source: Ambulatory Visit | Attending: Obstetrics & Gynecology | Admitting: Obstetrics & Gynecology

## 2020-12-24 ENCOUNTER — Other Ambulatory Visit: Payer: Self-pay

## 2020-12-24 DIAGNOSIS — Z1231 Encounter for screening mammogram for malignant neoplasm of breast: Secondary | ICD-10-CM | POA: Diagnosis present

## 2021-11-09 ENCOUNTER — Other Ambulatory Visit: Payer: Self-pay | Admitting: Specialist

## 2021-11-09 DIAGNOSIS — J4520 Mild intermittent asthma, uncomplicated: Secondary | ICD-10-CM

## 2021-11-09 DIAGNOSIS — R0602 Shortness of breath: Secondary | ICD-10-CM

## 2021-11-09 DIAGNOSIS — R9389 Abnormal findings on diagnostic imaging of other specified body structures: Secondary | ICD-10-CM

## 2021-11-15 ENCOUNTER — Ambulatory Visit: Payer: Medicare PPO

## 2021-11-22 ENCOUNTER — Ambulatory Visit: Payer: Medicare PPO

## 2021-11-30 ENCOUNTER — Ambulatory Visit: Payer: Medicare PPO

## 2021-12-07 ENCOUNTER — Ambulatory Visit
Admission: RE | Admit: 2021-12-07 | Discharge: 2021-12-07 | Disposition: A | Discharge status: Home / Self Care | Payer: Medicare PPO | Source: Ambulatory Visit | Attending: Specialist | Admitting: Specialist

## 2021-12-07 ENCOUNTER — Other Ambulatory Visit: Payer: Self-pay

## 2021-12-07 DIAGNOSIS — R0602 Shortness of breath: Secondary | ICD-10-CM | POA: Insufficient documentation

## 2021-12-07 DIAGNOSIS — R9389 Abnormal findings on diagnostic imaging of other specified body structures: Secondary | ICD-10-CM | POA: Diagnosis present

## 2021-12-07 DIAGNOSIS — J4520 Mild intermittent asthma, uncomplicated: Secondary | ICD-10-CM | POA: Diagnosis present

## 2021-12-12 ENCOUNTER — Other Ambulatory Visit: Payer: Self-pay

## 2021-12-12 DIAGNOSIS — Z1231 Encounter for screening mammogram for malignant neoplasm of breast: Secondary | ICD-10-CM

## 2022-02-22 ENCOUNTER — Ambulatory Visit
Admission: RE | Admit: 2022-02-22 | Discharge: 2022-02-22 | Disposition: A | Discharge status: Home / Self Care | Payer: Medicare PPO | Source: Ambulatory Visit

## 2022-02-22 DIAGNOSIS — Z1231 Encounter for screening mammogram for malignant neoplasm of breast: Secondary | ICD-10-CM | POA: Insufficient documentation

## 2022-02-23 ENCOUNTER — Other Ambulatory Visit: Payer: Self-pay

## 2022-02-23 DIAGNOSIS — N644 Mastodynia: Secondary | ICD-10-CM

## 2022-03-16 ENCOUNTER — Ambulatory Visit
Admission: RE | Admit: 2022-03-16 | Discharge: 2022-03-16 | Disposition: A | Discharge status: Home / Self Care | Payer: Medicare PPO | Source: Ambulatory Visit

## 2022-03-16 DIAGNOSIS — N644 Mastodynia: Secondary | ICD-10-CM

## 2023-07-05 ENCOUNTER — Other Ambulatory Visit: Payer: Self-pay

## 2023-07-05 DIAGNOSIS — Z1231 Encounter for screening mammogram for malignant neoplasm of breast: Secondary | ICD-10-CM

## 2023-08-16 ENCOUNTER — Ambulatory Visit
Admission: RE | Admit: 2023-08-16 | Discharge: 2023-08-16 | Disposition: A | Discharge status: Home / Self Care | Payer: Medicare PPO | Source: Ambulatory Visit

## 2023-08-16 DIAGNOSIS — Z1231 Encounter for screening mammogram for malignant neoplasm of breast: Secondary | ICD-10-CM | POA: Insufficient documentation

## 2024-03-13 ENCOUNTER — Ambulatory Visit: Payer: Self-pay

## 2024-03-13 DIAGNOSIS — Z1211 Encounter for screening for malignant neoplasm of colon: Secondary | ICD-10-CM | POA: Diagnosis not present

## 2024-03-13 DIAGNOSIS — Z860100 Personal history of colon polyps, unspecified: Secondary | ICD-10-CM | POA: Diagnosis not present

## 2024-03-13 DIAGNOSIS — K635 Polyp of colon: Secondary | ICD-10-CM | POA: Diagnosis not present

## 2024-03-13 DIAGNOSIS — K5730 Diverticulosis of large intestine without perforation or abscess without bleeding: Secondary | ICD-10-CM

## 2024-03-13 DIAGNOSIS — K641 Second degree hemorrhoids: Secondary | ICD-10-CM

## 2024-03-13 DIAGNOSIS — D175 Benign lipomatous neoplasm of intra-abdominal organs: Secondary | ICD-10-CM | POA: Diagnosis not present

## 2024-07-09 ENCOUNTER — Other Ambulatory Visit: Payer: Self-pay

## 2024-07-09 DIAGNOSIS — Z1231 Encounter for screening mammogram for malignant neoplasm of breast: Secondary | ICD-10-CM

## 2024-10-02 ENCOUNTER — Ambulatory Visit
Admission: RE | Admit: 2024-10-02 | Discharge: 2024-10-02 | Disposition: A | Discharge status: Home / Self Care | Source: Ambulatory Visit

## 2024-10-02 DIAGNOSIS — Z1231 Encounter for screening mammogram for malignant neoplasm of breast: Secondary | ICD-10-CM | POA: Insufficient documentation
